# Patient Record
Sex: Female | Born: 1960 | Race: White | Hispanic: No | Marital: Married | Smoking: Former smoker
Health system: Southern US, Community
[De-identification: ages and names within clinical notes are randomized; demographics above are authoritative.]

## PROBLEM LIST (undated history)

## (undated) DIAGNOSIS — J9383 Other pneumothorax: Secondary | ICD-10-CM

## (undated) DIAGNOSIS — K219 Gastro-esophageal reflux disease without esophagitis: Secondary | ICD-10-CM

## (undated) HISTORY — DX: Gastro-esophageal reflux disease without esophagitis: K21.9

## (undated) HISTORY — DX: Other pneumothorax: J93.83

---

## 1986-06-03 HISTORY — PX: AUGMENTATION MAMMAPLASTY: SUR837

## 1987-06-04 HISTORY — PX: LUNG SURGERY: SHX703

## 1998-02-02 ENCOUNTER — Other Ambulatory Visit: Admission: RE | Admit: 1998-02-02 | Discharge: 1998-02-02 | Payer: Self-pay | Admitting: Obstetrics and Gynecology

## 1999-02-15 ENCOUNTER — Other Ambulatory Visit: Admission: RE | Admit: 1999-02-15 | Discharge: 1999-02-15 | Payer: Self-pay | Admitting: Obstetrics and Gynecology

## 2000-02-19 ENCOUNTER — Other Ambulatory Visit: Admission: RE | Admit: 2000-02-19 | Discharge: 2000-02-19 | Payer: Self-pay | Admitting: Obstetrics and Gynecology

## 2000-02-21 ENCOUNTER — Encounter: Payer: Self-pay | Admitting: Obstetrics and Gynecology

## 2000-02-21 ENCOUNTER — Encounter: Admission: RE | Admit: 2000-02-21 | Discharge: 2000-02-21 | Payer: Self-pay | Admitting: Obstetrics and Gynecology

## 2004-04-02 ENCOUNTER — Ambulatory Visit: Payer: Self-pay | Admitting: Unknown Physician Specialty

## 2006-06-30 ENCOUNTER — Ambulatory Visit: Payer: Self-pay | Admitting: Unknown Physician Specialty

## 2007-04-10 ENCOUNTER — Ambulatory Visit: Payer: Self-pay | Admitting: Unknown Physician Specialty

## 2008-06-02 ENCOUNTER — Ambulatory Visit: Payer: Self-pay | Admitting: Unknown Physician Specialty

## 2010-04-02 ENCOUNTER — Ambulatory Visit: Payer: Self-pay | Admitting: Unknown Physician Specialty

## 2010-04-09 ENCOUNTER — Ambulatory Visit: Payer: Self-pay | Admitting: Unknown Physician Specialty

## 2010-04-16 ENCOUNTER — Ambulatory Visit: Payer: Self-pay

## 2010-04-16 ENCOUNTER — Ambulatory Visit: Payer: Self-pay | Admitting: Cardiology

## 2010-04-16 ENCOUNTER — Ambulatory Visit (HOSPITAL_COMMUNITY): Admission: RE | Admit: 2010-04-16 | Discharge: 2010-04-16 | Payer: Self-pay | Admitting: Unknown Physician Specialty

## 2010-04-18 ENCOUNTER — Encounter: Payer: Self-pay | Admitting: Cardiovascular Disease

## 2010-05-15 ENCOUNTER — Ambulatory Visit: Payer: Self-pay | Admitting: Unknown Physician Specialty

## 2010-05-22 LAB — HM MAMMOGRAPHY: HM Mammogram: NORMAL

## 2010-12-18 ENCOUNTER — Encounter: Payer: Self-pay | Admitting: Internal Medicine

## 2010-12-27 ENCOUNTER — Ambulatory Visit (AMBULATORY_SURGERY_CENTER): Payer: 59 | Admitting: *Deleted

## 2010-12-27 ENCOUNTER — Encounter: Payer: Self-pay | Admitting: Internal Medicine

## 2010-12-27 VITALS — Ht 64.0 in | Wt 130.0 lb

## 2010-12-27 DIAGNOSIS — Z1211 Encounter for screening for malignant neoplasm of colon: Secondary | ICD-10-CM

## 2010-12-27 MED ORDER — PEG-KCL-NACL-NASULF-NA ASC-C 100 G PO SOLR: ORAL | Status: DC

## 2011-01-16 ENCOUNTER — Telehealth: Payer: Self-pay | Admitting: Internal Medicine

## 2011-01-16 NOTE — Telephone Encounter (Signed)
RN OK'd taking ibuprofen for headache as directed OTC.

## 2011-01-17 ENCOUNTER — Encounter: Payer: Self-pay | Admitting: Internal Medicine

## 2011-01-17 ENCOUNTER — Ambulatory Visit (AMBULATORY_SURGERY_CENTER): Payer: 59 | Admitting: Internal Medicine

## 2011-01-17 VITALS — BP 123/68 | HR 69 | Temp 98.0°F | Resp 15 | Ht 65.0 in | Wt 125.0 lb

## 2011-01-17 DIAGNOSIS — Z1211 Encounter for screening for malignant neoplasm of colon: Secondary | ICD-10-CM

## 2011-01-17 MED ORDER — SODIUM CHLORIDE 0.9 % IV SOLN: 500.0000 mL | INTRAVENOUS | Status: DC

## 2011-01-17 NOTE — Patient Instructions (Signed)
Discharge instructions given and gone over with patient and caregiver.  We will call in the morning to follow-up.

## 2011-01-18 ENCOUNTER — Telehealth: Payer: Self-pay | Admitting: *Deleted

## 2011-01-18 NOTE — Telephone Encounter (Signed)

## 2011-04-23 LAB — HM PAP SMEAR: HM Pap smear: NORMAL

## 2011-05-06 ENCOUNTER — Encounter: Payer: Self-pay | Admitting: Internal Medicine

## 2011-05-07 ENCOUNTER — Ambulatory Visit: Payer: 59 | Admitting: Internal Medicine

## 2011-06-20 ENCOUNTER — Encounter: Payer: Self-pay | Admitting: Internal Medicine

## 2011-06-20 ENCOUNTER — Ambulatory Visit (INDEPENDENT_AMBULATORY_CARE_PROVIDER_SITE_OTHER): Payer: 59 | Admitting: Internal Medicine

## 2011-06-20 DIAGNOSIS — N951 Menopausal and female climacteric states: Secondary | ICD-10-CM

## 2011-06-20 DIAGNOSIS — M25552 Pain in left hip: Secondary | ICD-10-CM

## 2011-06-20 DIAGNOSIS — Z1211 Encounter for screening for malignant neoplasm of colon: Secondary | ICD-10-CM

## 2011-06-20 DIAGNOSIS — M25559 Pain in unspecified hip: Secondary | ICD-10-CM

## 2011-06-20 DIAGNOSIS — Z780 Asymptomatic menopausal state: Secondary | ICD-10-CM

## 2011-06-20 DIAGNOSIS — K219 Gastro-esophageal reflux disease without esophagitis: Secondary | ICD-10-CM

## 2011-06-20 DIAGNOSIS — J9383 Other pneumothorax: Secondary | ICD-10-CM

## 2011-06-20 NOTE — Patient Instructions (Signed)
You need to take 2000 units of Vit d3 daily through te winter and reduce to 1000 units daily once Spring is here  You need to get 1200 mg of calcium daily either through diet or supplements

## 2011-06-20 NOTE — Progress Notes (Signed)
  Subjective:    Patient ID: Joy Reid, female    DOB: 1960-11-30, 51 y.o.   MRN: [id number]  HPI [name] is a 72 yr menopausal white female with a history of bilateral spontaneous pnuemothoraces in her early twenties, s/p stapling who presents for establishment of primary care.  She has been having lateral left hip pain for several days now after jumping up and falling onto her bed during a recreational romp with her new puppy.  The pain was intense and she is worried that she might have broken her hip .  Her other complaint is recent onset of upper abdominal bloating and discomfort accompanied by a feeling of fullness.  The symptoms do not change with stooling or eating.  She has had no unintenional weight loss but thinks she may have gained a few lbs in the last several months.  No change in bowel habits,  She denies postprandial or otherwise vomiting but does report occasional mild transient nausea.  Thre is a remote history of tobacco abuse but no reported history of alcohol abuse or CA and she is up to fate on GYN screening.  Scheduled to see [name] in the next 30 days for evaluation of symptoms, recent colonoscopy was normal.   Past Medical History  Diagnosis Date  . GERD (gastroesophageal reflux disease)   . Spontaneous pneumothorax     as a teenager, bilateral (sequential) s/p surgical stapling  . Menopause        No current outpatient prescriptions on file prior to visit.   Review of Systems  Constitutional: Negative for fever, chills and unexpected weight change.  HENT: Negative for hearing loss, ear pain, nosebleeds, congestion, sore throat, facial swelling, rhinorrhea, sneezing, mouth sores, trouble swallowing, neck pain, neck stiffness, voice change, postnasal drip, sinus pressure, tinnitus and ear discharge.   Eyes: Negative for pain, discharge, redness and visual disturbance.  Respiratory: Negative for cough, chest tightness, shortness of breath, wheezing and stridor.     Cardiovascular: Negative for chest pain, palpitations and leg swelling.  Gastrointestinal: Positive for nausea and abdominal distention.  Genitourinary: Negative for urgency, flank pain, vaginal bleeding and dyspareunia.  Musculoskeletal: Negative for myalgias and arthralgias.  Skin: Negative for color change and rash.  Neurological: Negative for dizziness, weakness, light-headedness and headaches.  Hematological: Negative for adenopathy.       Objective:   Physical Exam  Constitutional: She is oriented to person, place, and time. She appears well-developed and well-nourished.  HENT:  Mouth/Throat: Oropharynx is clear and moist.  Eyes: EOM are normal. Pupils are equal, round, and reactive to light. No scleral icterus.  Neck: Normal range of motion. Neck supple. No JVD present. No thyromegaly present.  Cardiovascular: Normal rate, regular rhythm, normal heart sounds and intact distal pulses.   Pulmonary/Chest: Effort normal and breath sounds normal.  Abdominal: Soft. Bowel sounds are normal. She exhibits no mass. There is no tenderness. There is no rebound and no guarding.  Musculoskeletal: Normal range of motion. She exhibits no edema.       Left hip: She exhibits normal range of motion, normal strength, no tenderness, no bony tenderness and no swelling.  Lymphadenopathy:    She has no cervical adenopathy.  Neurological: She is alert and oriented to person, place, and time.  Skin: Skin is warm and dry.  Psychiatric: She has a normal mood and affect.       Assessment & Plan:

## 2011-06-23 ENCOUNTER — Encounter: Payer: Self-pay | Admitting: Internal Medicine

## 2011-06-23 DIAGNOSIS — M25552 Pain in left hip: Secondary | ICD-10-CM | POA: Insufficient documentation

## 2011-06-23 DIAGNOSIS — Z1211 Encounter for screening for malignant neoplasm of colon: Secondary | ICD-10-CM | POA: Insufficient documentation

## 2011-06-23 DIAGNOSIS — K219 Gastro-esophageal reflux disease without esophagitis: Secondary | ICD-10-CM | POA: Insufficient documentation

## 2011-06-23 DIAGNOSIS — Z780 Asymptomatic menopausal state: Secondary | ICD-10-CM | POA: Insufficient documentation

## 2011-06-23 NOTE — Assessment & Plan Note (Addendum)
Prior workup unknown since the phenomena occurred over 20 years ago.  She does not appear Marfanoid.  There is a personal history of prior tobacco abuse, no endometriosis and no  family history of PTX to suggest an inheritable syndrome .  One episode did occur while flying so really cannot be considered spontaneous.

## 2011-06-23 NOTE — Assessment & Plan Note (Signed)
Screening colonoscopy was done by [name] recently and reportedly normal.

## 2011-06-23 NOTE — Assessment & Plan Note (Signed)
Exam is normal. Reassurance provided that it is unlikely that she has broken her hip by falling onto her bed.  She has most likely bruised the fascia from blunt trauma.

## 2011-06-23 NOTE — Assessment & Plan Note (Signed)
Occurring in the past year, with tolerable symptoms.  Disussed screening for bone loss at some point during the first 5 years with DEXA and reviewed calcium and vitamin D needs .   She does exercise regularly.

## 2011-06-23 NOTE — Assessment & Plan Note (Signed)
She has no signs of abdominal mass or peptic ulcer on exam.  Recommended tiral of omeprazole 20 mg daily pending evaluation by Dr. Juanda Chance

## 2011-07-02 ENCOUNTER — Encounter: Payer: 59 | Admitting: Internal Medicine

## 2011-07-02 NOTE — Progress Notes (Signed)
Joy Reid [birth year] MRN [id number]        History of Present Illness:  This is a    Past Medical History  Diagnosis Date  . GERD (gastroesophageal reflux disease)   . Spontaneous pneumothorax     as a teenager, bilateral (sequential) s/p surgical stapling  . Spontaneous pneumothorax     as a teenager, bilateral (sequential) s/p surgical stapling   Past Surgical History  Procedure Date  . Cesarean section 1995    x 3  . Lung surgery 1989    2nd lung stapling,  left side for recurrent PTX    reports that she quit smoking about 27 years ago. She does not have any smokeless tobacco history on file. She reports that she drinks about 1.8 ounces of alcohol per week. She reports that she does not use illicit drugs. family history includes Colon cancer in her maternal grandfather.  There is no history of Cancer, and Hearing loss, and Heart disease, and Diabetes, . No Known Allergies      Review of Systems:  The remainder of the 10 point ROS is negative except as outlined in H&P   Physical Exam: General appearance  Well developed, in no distress. Eyes- non icteric. HEENT nontraumatic, normocephalic. Mouth no lesions, tongue papillated, no cheilosis. Neck supple without adenopathy, thyroid not enlarged, no carotid bruits, no JVD. Lungs Clear to auscultation bilaterally. Cor normal S1, normal S2, regular rhythm, no murmur,  quiet precordium. Abdomen:  Rectal: Extremities no pedal edema. Skin no lesions. Neurological alert and oriented x 3. Psychological normal mood and affect.  Assessment and Plan:   07/02/2011 Joy Reid

## 2011-07-03 NOTE — Progress Notes (Signed)
This encounter was created in error - please disregard.

## 2011-07-23 ENCOUNTER — Telehealth: Payer: Self-pay | Admitting: Internal Medicine

## 2011-07-23 NOTE — Telephone Encounter (Signed)
Message copied by Arna Snipe on Tue Jul 23, 2011  3:06 PM ------      Message from: Richardson Chiquito      Created: Wed Jul 03, 2011  8:02 AM                   ----- Message -----         From: Hart Carwin, MD         Sent: 07/02/2011   5:34 PM           To: Vernia Buff, CMA            Please charge for no show.      ----- Message -----         From: Vernia Buff, CMA         Sent: 07/02/2011  10:47 AM           To: Hart Carwin, MD            Patient n/s appt with Dr Juanda Chance on 07/02/11. Dr Juanda Chance, do you want to charge late cancellation/no show fee?

## 2011-08-12 ENCOUNTER — Telehealth: Payer: Self-pay | Admitting: Internal Medicine

## 2011-08-12 ENCOUNTER — Ambulatory Visit: Payer: 59 | Admitting: Internal Medicine

## 2011-08-12 NOTE — Telephone Encounter (Signed)
Patient would like an xray she states that she jumped back in December she felt something in her left hip she didn't hear anything but she was in a lot of pain. Please advise.

## 2011-08-12 NOTE — Telephone Encounter (Signed)
Patient made an appt for today.

## 2011-08-13 ENCOUNTER — Ambulatory Visit (INDEPENDENT_AMBULATORY_CARE_PROVIDER_SITE_OTHER): Payer: 59 | Admitting: Internal Medicine

## 2011-08-13 ENCOUNTER — Encounter: Payer: Self-pay | Admitting: Internal Medicine

## 2011-08-13 DIAGNOSIS — M25552 Pain in left hip: Secondary | ICD-10-CM

## 2011-08-13 DIAGNOSIS — M25559 Pain in unspecified hip: Secondary | ICD-10-CM

## 2011-08-13 MED ORDER — TRAMADOL HCL 50 MG PO TABS
50.0000 mg | ORAL_TABLET | Freq: Three times a day (TID) | ORAL | Status: AC | PRN
Start: 2011-08-13 — End: 2011-08-23

## 2011-08-13 MED ORDER — MELOXICAM 15 MG PO TABS: 15.0000 mg | ORAL_TABLET | Freq: Every day | ORAL | Status: AC

## 2011-08-13 NOTE — Assessment & Plan Note (Signed)
Secondary to iliotibial tract syndrome .  NSAIDs, heat, massage, pain relievers.  Pt offered but deferred.

## 2011-08-13 NOTE — Progress Notes (Signed)
  Subjective:    Patient ID: Joy Reid, female    DOB: [birth year], 51 y.o.   MRN: [id number]  HPI Ms. Hartzog returns with persistent left lateral thigh /hip pain  That has persisted for two months after falling onto her bed during a romp with her dog. Pain is aggravated by lying on either side, but not by ascending or descending stairs. No swelling.  Has not started exercising,  Has gained a few pounds.   Past Medical History  Diagnosis Date  . GERD (gastroesophageal reflux disease)   . Spontaneous pneumothorax     as a teenager, bilateral (sequential) s/p surgical stapling  . Spontaneous pneumothorax     as a teenager, bilateral (sequential) s/p surgical stapling   No current outpatient prescriptions on file prior to visit.    Review of Systems  Constitutional: Negative for fever, chills and unexpected weight change.  HENT: Negative for hearing loss, ear pain, nosebleeds, congestion, sore throat, facial swelling, rhinorrhea, sneezing, mouth sores, trouble swallowing, neck pain, neck stiffness, voice change, postnasal drip, sinus pressure, tinnitus and ear discharge.   Eyes: Negative for pain, discharge, redness and visual disturbance.  Respiratory: Negative for cough, chest tightness, shortness of breath, wheezing and stridor.   Cardiovascular: Negative for chest pain, palpitations and leg swelling.  Musculoskeletal: Positive for arthralgias. Negative for myalgias.  Skin: Negative for color change and rash.  Neurological: Negative for dizziness, weakness, light-headedness and headaches.  Hematological: Negative for adenopathy.       Objective:   Physical Exam  Constitutional: She is oriented to person, place, and time. She appears well-developed and well-nourished.  HENT:  Mouth/Throat: Oropharynx is clear and moist.  Eyes: EOM are normal. Pupils are equal, round, and reactive to light. No scleral icterus.  Neck: Normal range of motion. Neck supple. No JVD present. No thyromegaly  present.  Cardiovascular: Normal rate, regular rhythm, normal heart sounds and intact distal pulses.   Pulmonary/Chest: Effort normal and breath sounds normal.  Abdominal: Soft. Bowel sounds are normal. She exhibits no mass. There is no tenderness.  Musculoskeletal: Normal range of motion. She exhibits tenderness. She exhibits no edema.       Arms: Lymphadenopathy:    She has no cervical adenopathy.  Neurological: She is alert and oriented to person, place, and time.  Skin: Skin is warm and dry.  Psychiatric: She has a normal mood and affect.      Assessment & Plan:    Hip pain, left Secondary to iliotibial tract syndrome .  NSAIDs, heat, massage, pain relievers.  Pt offered but deferred.    Updated Medication List Outpatient Encounter Prescriptions as of 08/13/2011  Medication Sig Dispense Refill  . meloxicam (MOBIC) 15 MG tablet Take 1 tablet (15 mg total) by mouth daily.  30 tablet  0  . traMADol (ULTRAM) 50 MG tablet Take 1 tablet (50 mg total) by mouth every 8 (eight) hours as needed for pain.  60 tablet  0

## 2011-08-13 NOTE — Patient Instructions (Signed)
You have an iliotibial tract injury which is causing your pain  I recommended trying 1 meloxicam daily as your anti inflammatory ,  Instead of advil or alleve.  add tramadol as needed for pain.Marland Kitchen  Heating pad for 15 minutes prior to stretching and exercise.

## 2011-08-16 ENCOUNTER — Telehealth: Payer: Self-pay | Admitting: *Deleted

## 2011-08-16 NOTE — Telephone Encounter (Signed)
Triage Record Num: [id number] Operator: Neita Goodnight Patient Name: Joy Reid Call Date & Time: 08/16/2011 4:32:34PM Patient Phone: [phone] PCP: Duncan Dull Patient Gender: Female PCP Fax : [phone] Patient DOB: December 17, 1960 Practice Name: [facility] Reason for Call: Caller: [name]/Patient is calling requesting abx. Cell Phone [phone]. Caller states she was "just seen by Dr. Darrick Huntsman, and they "discussed the cough, congestion, etc." Has had occasional sniffles, but runny nose worsened on 03/15, clear nasal discharge. Possible fever to touch on 03/15. Triage offered and declined. Pt was advised no abx to be called in or requested without an office visit, however she "requests to ask the doctor this time since she was just seen." (RN has no access to [facility] due to server she is currently on.) Per office, she was seen 03/12 for hip pain, and there is no note in her chart about being assessed for cold. Pt advised again of no abx to be called in without a visit. Recommended she go to UC if needed. Protocol(s) Used: Office Note Recommended Outcome per Protocol: Information Noted and Sent to Office Reason for Outcome: Caller information to office Care Advice: ~ 03/

## 2011-08-19 ENCOUNTER — Telehealth: Payer: Self-pay | Admitting: Internal Medicine

## 2011-08-19 NOTE — Telephone Encounter (Signed)
[facility] Triage Call Report Triage Record Num: [id number] Operator: Arline Asp Loftin Patient Name: Joy Reid Call Date & Time: 08/16/2011 4:32:34PM Patient Phone: [phone] PCP: Duncan Dull Patient Gender: Female PCP Fax : [phone] Patient DOB: 07/03/1960 Practice Name: [facility] Reason for Call: Caller: Noreta/Patient is calling requesting abx. Cell Phone [phone]. Caller states she was "just seen by Dr. Darrick Huntsman, and they "discussed the cough, congestion, etc." Has had occasional sniffles, but runny nose worsened on 03/15, clear nasal discharge. Possible fever to touch on 03/15. Triage offered and declined. Pt was advised no abx to be called in or requested without an office visit, however she "requests to ask the doctor this time since she was just seen." (RN has no access to [facility] due to server she is currently on.) Per office, she was seen 03/12 for hip pain, and there is no note in her chart about being assessed for cold. Pt advised again of no abx to be called in without a visit. Recommended she go to UC if needed. Protocol(s) Used: Office Note Recommended Outcome per Protocol: Information Noted and Sent to Office Reason for Outcome: Caller information to office Care Advice: ~ 08/16/2011 4:50:43PM Page 1 of 1 [email or web address]

## 2011-11-26 DIAGNOSIS — M722 Plantar fascial fibromatosis: Secondary | ICD-10-CM | POA: Insufficient documentation

## 2011-11-26 DIAGNOSIS — M79673 Pain in unspecified foot: Secondary | ICD-10-CM | POA: Insufficient documentation

## 2011-11-26 DIAGNOSIS — Q667 Congenital pes cavus, unspecified foot: Secondary | ICD-10-CM | POA: Insufficient documentation

## 2011-11-26 DIAGNOSIS — M7750 Other enthesopathy of unspecified foot: Secondary | ICD-10-CM | POA: Insufficient documentation

## 2011-12-10 ENCOUNTER — Ambulatory Visit: Payer: 59 | Admitting: Internal Medicine

## 2012-06-04 ENCOUNTER — Telehealth: Payer: Self-pay | Admitting: Internal Medicine

## 2012-06-04 NOTE — Telephone Encounter (Signed)
Patient is wanting Dr. Melina Schools E-mail address.

## 2012-06-08 NOTE — Telephone Encounter (Signed)
Information given to pt.

## 2012-06-08 NOTE — Telephone Encounter (Signed)
You may give Stewart my e mail and my cell phone.  [email or web address]    And [phone]

## 2012-07-01 ENCOUNTER — Telehealth: Payer: Self-pay | Admitting: *Deleted

## 2012-07-01 DIAGNOSIS — R5383 Other fatigue: Secondary | ICD-10-CM

## 2012-07-01 DIAGNOSIS — Z13220 Encounter for screening for lipoid disorders: Secondary | ICD-10-CM

## 2012-07-01 DIAGNOSIS — E559 Vitamin D deficiency, unspecified: Secondary | ICD-10-CM

## 2012-07-01 NOTE — Telephone Encounter (Signed)
Labs ordered.

## 2012-07-01 NOTE — Addendum Note (Signed)
Addended by: Sherlene Shams on: 07/01/2012 05:15 PM   Modules accepted: Orders

## 2012-07-01 NOTE — Telephone Encounter (Signed)
Pt is coming in for labs tomorrow (01.30.2014) what labs and dx would you like? Thank you 

## 2012-07-02 ENCOUNTER — Other Ambulatory Visit: Payer: 59

## 2012-07-07 ENCOUNTER — Telehealth: Payer: Self-pay | Admitting: Internal Medicine

## 2012-07-07 DIAGNOSIS — E559 Vitamin D deficiency, unspecified: Secondary | ICD-10-CM | POA: Insufficient documentation

## 2012-07-07 MED ORDER — ERGOCALCIFEROL 1.25 MG (50000 UT) PO CAPS: 50000.0000 [IU] | ORAL_CAPSULE | ORAL | Status: DC

## 2012-07-07 NOTE — Telephone Encounter (Signed)
Pt.notified

## 2012-07-07 NOTE — Telephone Encounter (Signed)
All labs normal,. Including thyroid function and cholesterol , except her vitamin d is low at 16 (normal is 30 to 100) . rx Drisdol for weekly use x 3 months,  Then in the spring she can resume otc vit d 3 1000 units daily

## 2012-07-09 ENCOUNTER — Encounter: Payer: 59 | Admitting: Internal Medicine

## 2012-07-13 ENCOUNTER — Encounter: Payer: Self-pay | Admitting: Internal Medicine

## 2012-08-10 ENCOUNTER — Encounter: Payer: 59 | Admitting: Internal Medicine

## 2012-12-08 DIAGNOSIS — R490 Dysphonia: Secondary | ICD-10-CM | POA: Insufficient documentation

## 2012-12-18 ENCOUNTER — Ambulatory Visit (INDEPENDENT_AMBULATORY_CARE_PROVIDER_SITE_OTHER): Payer: BC Managed Care – PPO | Admitting: Internal Medicine

## 2012-12-18 ENCOUNTER — Encounter: Payer: Self-pay | Admitting: Internal Medicine

## 2012-12-18 VITALS — BP 108/72 | HR 62 | Temp 98.1°F | Resp 14 | Ht 65.0 in | Wt 126.5 lb

## 2012-12-18 DIAGNOSIS — Z1239 Encounter for other screening for malignant neoplasm of breast: Secondary | ICD-10-CM

## 2012-12-18 DIAGNOSIS — Z0000 Encounter for general adult medical examination without abnormal findings: Secondary | ICD-10-CM

## 2012-12-18 DIAGNOSIS — Z1211 Encounter for screening for malignant neoplasm of colon: Secondary | ICD-10-CM

## 2012-12-18 NOTE — Progress Notes (Signed)
Patient ID: Joy Reid, female   DOB: 1961/03/22, 52 y.o.   MRN: [id number]  Subjective:     Joy Reid is a 52 y.o. female and is here for a comprehensive physical exam. The patient reports problems.  She is considering surgery by Dr. 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 Marital Status: Married    Spouse Name: N/A    Number of Children: N/A  . Years of Education: N/A   Occupational History  . Not on file.   Social History Main Topics  . Smoking status: Former Smoker    Quit date: 12/27/1983  . Smokeless tobacco: Not on file  . Alcohol Use: 1.8 oz/week    3 Cans of beer per week  . Drug Use: No  . Sexually Active: Not on file   Other Topics Concern  . Not on file   Social History Narrative  . No narrative on file   Health Maintenance  Topic Date Due  . Mammogram  05/15/2012  . Influenza Vaccine  02/01/2013  . Pap Smear  03/13/2014  . Tetanus/tdap  06/19/2020  . Colonoscopy  01/16/2021    The following portions of the patient's history were reviewed and updated as appropriate: allergies, current medications, past family history, past medical history, past social history, past surgical history and problem list.  Review of Systems A comprehensive review of systems was negative.   Objective:   BP 108/72  Pulse 62  Temp(Src) 98.1 F (36.7 C) (Oral)  Resp 14  Ht 5\' 5"  (1.651 m)  Wt 126 lb 8 oz (57.38 kg)  BMI 21.05 kg/m2  SpO2 99%  General Appearance:    Alert, cooperative, no distress, appears stated age  Head:    Normocephalic, without obvious abnormality, atraumatic  Eyes:    PERRL, conjunctiva/corneas clear, EOM's intact, fundi    benign, both eyes  Ears:    Normal TM's and external ear canals, both ears  Nose:   Nares normal, septum midline, mucosa normal, no drainage    or sinus tenderness  Throat:   Lips, mucosa, and tongue  normal; teeth and gums normal  Neck:   Supple, symmetrical, trachea midline, no adenopathy;    thyroid:  no enlargement/tenderness/nodules; no carotid   bruit or JVD  Back:     Symmetric, no curvature, ROM normal, no CVA tenderness  Lungs:     Clear to auscultation bilaterally, respirations unlabored  Chest Wall:    No tenderness or deformity   Heart:    Regular rate and rhythm, S1 and S2 normal, no murmur, rub   or gallop  Breast Exam:    Bilateral implants, no tenderness, masses, or nipple abnormality  Abdomen:     Soft, non-tender, bowel sounds active all four quadrants,    no masses, no organomegaly  Genitalia:    Pelvic: cervix normal in appearance, external genitalia normal, no adnexal masses or tenderness, no cervical motion tenderness, rectovaginal septum normal, uterus normal size, shape, and consistency and vagina normal without discharge  Extremities:   Extremities normal, atraumatic, no cyanosis or edema  Pulses:   2+ and symmetric all extremities  Skin:   Skin color, texture, turgor normal, no rashes or lesions  Lymph nodes:   Cervical, supraclavicular, and axillary nodes normal  Neurologic:   CNII-XII intact, normal strength, sensation and reflexes    throughout  Assessment:   Screening for colon cancer Normal colonoscopy in 2012  Encounter for preventive health examination Annual comprehensive exam was done including breast, pelvic without PAP smear. All screenings have been addressed .   Screening for breast cancer She is long overdue for mammogram .  This has been ordered today    Updated Medication List Outpatient Encounter Prescriptions as of 12/18/2012  Medication Sig Dispense Refill  . ergocalciferol (DRISDOL) 50000 UNITS capsule Take 1 capsule (50,000 Units total) by mouth once a week.  12 capsule  0   No facility-administered encounter medications on file as of 12/18/2012.

## 2012-12-18 NOTE — Assessment & Plan Note (Addendum)
Normal colonoscopy in 2012

## 2012-12-20 DIAGNOSIS — Z1239 Encounter for other screening for malignant neoplasm of breast: Secondary | ICD-10-CM | POA: Insufficient documentation

## 2012-12-20 NOTE — Assessment & Plan Note (Signed)
Annual comprehensive exam was done including breast, pelvic without PAP smear. All screenings have been addressed .  

## 2012-12-20 NOTE — Assessment & Plan Note (Signed)
She is long overdue for mammogram .  This has been ordered today

## 2012-12-25 ENCOUNTER — Ambulatory Visit: Payer: Self-pay | Admitting: Unknown Physician Specialty

## 2013-03-16 ENCOUNTER — Encounter (INDEPENDENT_AMBULATORY_CARE_PROVIDER_SITE_OTHER): Payer: Self-pay

## 2013-03-16 ENCOUNTER — Ambulatory Visit (INDEPENDENT_AMBULATORY_CARE_PROVIDER_SITE_OTHER): Payer: BC Managed Care – PPO | Admitting: Internal Medicine

## 2013-03-16 ENCOUNTER — Encounter: Payer: Self-pay | Admitting: Internal Medicine

## 2013-03-16 VITALS — BP 110/70 | HR 63 | Temp 98.3°F | Ht 65.0 in | Wt 124.8 lb

## 2013-03-16 DIAGNOSIS — N390 Urinary tract infection, site not specified: Secondary | ICD-10-CM

## 2013-03-16 LAB — POCT URINALYSIS DIPSTICK
Bilirubin, UA: NEGATIVE
Blood, UA: NEGATIVE
Glucose, UA: NEGATIVE
Nitrite, UA: NEGATIVE
Urobilinogen, UA: 0.2

## 2013-03-17 ENCOUNTER — Encounter: Payer: Self-pay | Admitting: Internal Medicine

## 2013-03-17 NOTE — Progress Notes (Signed)
  Subjective:    Patient ID: Joy Reid, female    DOB: August 04, 1960, 52 y.o.   MRN: [id number]  HPI 52 year old female with past history of GERD who comes in today as a work in with concerns regarding some increased urinary urgency and pressure.  No significant dysuria.  No hematuria.  No abdominal pain or cramping.  No fever.  Eating and drinking well.  No vaginal itching, irritation or discharge.     Past Medical History  Diagnosis Date  . GERD (gastroesophageal reflux disease)   . Spontaneous pneumothorax     as a teenager, bilateral (sequential) s/p surgical stapling  . Spontaneous pneumothorax     as a teenager, bilateral (sequential) s/p surgical stapling    Review of Systems Patient denies any fever.  No nausea or vomiting.  No abdominal pain or cramping.  Some minimal suprapubic pressure.  No significant pain.  No nausea or vomiting.  Eating and drinking well.  No bowel change, such as diarrhea.  Some increased urgency.  Some increased pressure.  No dysuria or hematuria.         Objective:   Physical Exam Filed Vitals:   03/16/13 0913  BP: 110/70  Pulse: 63  Temp: 98.3 F (77.49 C)   52 year old female in no acute distress.  HEART:  Appears to be regular. LUNGS:  No crackles or wheezing audible.  Respirations even and unlabored.    ABDOMEN:  Soft, nontender.  Bowel sounds present and normal.  No audible abdominal bruit.  GU:  She declined.       Assessment & Plan:  GU.  With the urgency and pressure as outlined, urinalysis obtained.  Urine dip negative.  Discussed with her today regarding further evaluation and treatment options.  Will hold on abx.  Add culture.  Discussed vaginal evaluation.  She declined.  Wants to wait on culture results.  Will hold on pyridium since no significant pain.  F/u on culture.  Pt comfortable with this plan.

## 2013-03-18 LAB — URINE CULTURE
Colony Count: NO GROWTH
Organism ID, Bacteria: NO GROWTH

## 2013-12-16 ENCOUNTER — Other Ambulatory Visit: Payer: Self-pay | Admitting: Internal Medicine

## 2013-12-16 DIAGNOSIS — Z1239 Encounter for other screening for malignant neoplasm of breast: Secondary | ICD-10-CM

## 2013-12-16 NOTE — Progress Notes (Signed)
Placed order for Dr. Derrel Nip to sign for yearly Mammo screening

## 2013-12-27 ENCOUNTER — Ambulatory Visit
Admission: RE | Admit: 2013-12-27 | Discharge: 2013-12-27 | Disposition: A | Discharge status: Home / Self Care | Payer: BC Managed Care – PPO | Source: Ambulatory Visit | Attending: Internal Medicine | Admitting: Internal Medicine

## 2013-12-27 ENCOUNTER — Other Ambulatory Visit: Payer: Self-pay | Admitting: Internal Medicine

## 2013-12-27 DIAGNOSIS — Z1239 Encounter for other screening for malignant neoplasm of breast: Secondary | ICD-10-CM

## 2013-12-27 DIAGNOSIS — Z1231 Encounter for screening mammogram for malignant neoplasm of breast: Secondary | ICD-10-CM

## 2014-01-27 ENCOUNTER — Telehealth: Payer: Self-pay | Admitting: Internal Medicine

## 2014-01-27 NOTE — Telephone Encounter (Signed)
Farheen saw me in the hospital yesterday and spoke at length about her mother having mental status changes that may have indicated she had a stroke. I would like to her her mother in asap to see (but I can't remember her name and it was too awkward to ask because she had called a few days ago to talk about her mom's macular degeneration and I deferred) Can you call her Driday morning and schedule her mother for 1:00 pm?

## 2014-01-28 ENCOUNTER — Telehealth: Payer: Self-pay | Admitting: Internal Medicine

## 2014-01-28 NOTE — Telephone Encounter (Signed)
Patient's mother is Norman Herrlich, I already have a 1:00 appt today so we'll have to give her an appt on Monday , you can use 1:00 then

## 2014-01-28 NOTE — Telephone Encounter (Signed)
Patient's mother is Norman Herrlich,  I already have a 1:00 appt today so we'll have to give her an appt on Monday , you can use 1:00 then

## 2014-01-28 NOTE — Telephone Encounter (Signed)
Spoke with Rosaleah, she will check with patient and confirm Monday at 1:00 will work, will call back to confirm

## 2014-01-28 NOTE — Telephone Encounter (Signed)
See other phone note

## 2014-05-31 ENCOUNTER — Telehealth: Payer: Self-pay | Admitting: Internal Medicine

## 2014-05-31 NOTE — Telephone Encounter (Signed)
Patient called for acute visit for sinus offered appointment for 06/01/14 patient declined stating she would make another arrangement due to prior engagement.

## 2014-07-28 ENCOUNTER — Telehealth: Payer: Self-pay | Admitting: Internal Medicine

## 2014-07-28 NOTE — Telephone Encounter (Signed)
[facility]  Patient Name: Joy Reid  DOB: [birth year]    Initial Comment Caller states that ran into the closet a week ago and still has a black eye from it and has a dull headache from it. Had fallen down the states 3 days before.    Nurse Assessment  Nurse: Harlow Mares, RN, Suanne Marker Date/Time (Eastern Time): 07/28/2014 4:31:45 PM  Confirm and document reason for call. If symptomatic, describe symptoms. ---Caller states that ran into the closet a week ago and still has a black eye from it and has a dull headache from it. Had fallen down the states 3 days before. She reports that she fell down 3 steps and hit her knees very hard. (was this last Friday), she reports that she was hit by the closet door and was almost knocked out. She had a bruise on the (L) eye, upper lip busted inside and out, bruising on her chin. Both eyes are a bit black, but the (L) eye is worse. Rates headache= 2-3 and is intermittent. Describes as a dull ache.  Has the patient traveled out of the country within the last 30 days? ---No  Does the patient require triage? ---Yes  Related visit to physician within the last 2 weeks? ---No  Does the PT have any chronic conditions? (i.e. diabetes, asthma, etc.) ---No     Guidelines    Guideline Title Affirmed Question Affirmed Notes  Head Injury [1] After 72 hours AND [2] headache persists    Final Disposition User   See PCP When Office is Open (within 3 days) Harlow Mares, RN, Suanne Marker    Comments  Appointment scheduled with Lorane Gell, NP for 07/29/14 @ 11:30am. caller voiced understanding.Marland Kitchen

## 2014-07-28 NOTE — Telephone Encounter (Signed)
FYI

## 2014-07-29 ENCOUNTER — Encounter: Payer: Self-pay | Admitting: Internal Medicine

## 2014-07-29 ENCOUNTER — Encounter (INDEPENDENT_AMBULATORY_CARE_PROVIDER_SITE_OTHER): Payer: Self-pay

## 2014-07-29 ENCOUNTER — Ambulatory Visit: Payer: Self-pay | Admitting: Nurse Practitioner

## 2014-07-29 ENCOUNTER — Ambulatory Visit (INDEPENDENT_AMBULATORY_CARE_PROVIDER_SITE_OTHER)
Admission: RE | Admit: 2014-07-29 | Discharge: 2014-07-29 | Disposition: A | Discharge status: Home / Self Care | Payer: BLUE CROSS/BLUE SHIELD | Source: Ambulatory Visit | Attending: Internal Medicine | Admitting: Internal Medicine

## 2014-07-29 ENCOUNTER — Ambulatory Visit (INDEPENDENT_AMBULATORY_CARE_PROVIDER_SITE_OTHER): Payer: BLUE CROSS/BLUE SHIELD | Admitting: Internal Medicine

## 2014-07-29 VITALS — BP 118/64 | HR 59 | Temp 97.3°F | Resp 16 | Ht 65.0 in | Wt 129.0 lb

## 2014-07-29 DIAGNOSIS — S069X9S Unspecified intracranial injury with loss of consciousness of unspecified duration, sequela: Secondary | ICD-10-CM | POA: Diagnosis not present

## 2014-07-29 DIAGNOSIS — G44309 Post-traumatic headache, unspecified, not intractable: Secondary | ICD-10-CM

## 2014-07-29 DIAGNOSIS — M542 Cervicalgia: Secondary | ICD-10-CM

## 2014-07-29 DIAGNOSIS — S060X9S Concussion with loss of consciousness of unspecified duration, sequela: Secondary | ICD-10-CM

## 2014-07-29 DIAGNOSIS — S060XAS Concussion with loss of consciousness status unknown, sequela: Secondary | ICD-10-CM

## 2014-07-29 DIAGNOSIS — M25552 Pain in left hip: Secondary | ICD-10-CM | POA: Diagnosis not present

## 2014-07-29 MED ORDER — METHOCARBAMOL 750 MG PO TABS: 750.0000 mg | ORAL_TABLET | Freq: Four times a day (QID) | ORAL | Status: DC

## 2014-07-29 MED ORDER — MELOXICAM 15 MG PO TABS: 15.0000 mg | ORAL_TABLET | Freq: Every day | ORAL | Status: DC

## 2014-07-29 NOTE — Assessment & Plan Note (Signed)
Secondary to muscle spasm.  Cervical spine films to rule out alignment issues.  meloxicam and methocarbamol

## 2014-07-29 NOTE — Patient Instructions (Addendum)
MELOXICAM  ONCE DAILY INSTEAD OF ALEVE OR MOTRIN'  TYLENOL UP TO 4 TIMES DAILY TO SUPPLEMENT MELOXICAM  ROBAXIN (METHOCARBAMOL) IS A MUSCLE RELAXER AND WILL MAKE YOU SLEEPY   PLEASE GO GET PLAIN  FILMS (X RAYS ) OF CERVICAL SPINE AT [facility]   AFTER 1:00
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
## 2014-07-29 NOTE — Telephone Encounter (Signed)
Patient on schedule for 11:30.

## 2014-07-29 NOTE — Assessment & Plan Note (Deleted)
Patient's neurologic exam is normal and there is no scalp, zygoma or facial tenderness. melosicam and tylenol.

## 2014-07-29 NOTE — Assessment & Plan Note (Signed)
Patient's neurologic exam is normal and there is no scalp, zygoma or facial tenderness. melosicam and tylenol.

## 2014-07-29 NOTE — Progress Notes (Signed)
Pre-visit discussion using our clinic review tool. No additional management support is needed unless otherwise documented below in the visit note.  

## 2015-02-28 ENCOUNTER — Other Ambulatory Visit: Payer: Self-pay | Admitting: Internal Medicine

## 2015-02-28 DIAGNOSIS — M5090 Cervical disc disorder, unspecified, unspecified cervical region: Secondary | ICD-10-CM | POA: Insufficient documentation

## 2015-03-01 ENCOUNTER — Other Ambulatory Visit: Payer: Self-pay | Admitting: Internal Medicine

## 2015-03-01 DIAGNOSIS — E785 Hyperlipidemia, unspecified: Secondary | ICD-10-CM

## 2015-03-01 DIAGNOSIS — E559 Vitamin D deficiency, unspecified: Secondary | ICD-10-CM

## 2015-03-01 DIAGNOSIS — Z113 Encounter for screening for infections with a predominantly sexual mode of transmission: Secondary | ICD-10-CM

## 2015-03-01 DIAGNOSIS — R5383 Other fatigue: Secondary | ICD-10-CM

## 2015-03-02 ENCOUNTER — Other Ambulatory Visit: Payer: Self-pay

## 2015-03-03 ENCOUNTER — Other Ambulatory Visit (INDEPENDENT_AMBULATORY_CARE_PROVIDER_SITE_OTHER): Payer: BLUE CROSS/BLUE SHIELD

## 2015-03-03 DIAGNOSIS — Z113 Encounter for screening for infections with a predominantly sexual mode of transmission: Secondary | ICD-10-CM

## 2015-03-03 DIAGNOSIS — E559 Vitamin D deficiency, unspecified: Secondary | ICD-10-CM | POA: Diagnosis not present

## 2015-03-03 DIAGNOSIS — E785 Hyperlipidemia, unspecified: Secondary | ICD-10-CM | POA: Diagnosis not present

## 2015-03-03 DIAGNOSIS — R5383 Other fatigue: Secondary | ICD-10-CM | POA: Diagnosis not present

## 2015-03-03 LAB — LIPID PANEL
CHOL/HDL RATIO: 2
Cholesterol: 233 mg/dL — ABNORMAL HIGH (ref 0–200)
HDL: 96.1 mg/dL (ref >39.00)
LDL CALC: 127 mg/dL — AB (ref 0–99)
NONHDL: 137.39
TRIGLYCERIDES: 51 mg/dL (ref 0.0–149.0)
VLDL: 10.2 mg/dL (ref 0.0–40.0)

## 2015-03-03 LAB — COMPREHENSIVE METABOLIC PANEL
ALK PHOS: 45 U/L (ref 39–117)
ALT: 15 U/L (ref 0–35)
AST: 20 U/L (ref 0–37)
Albumin: 4.4 g/dL (ref 3.5–5.2)
BILIRUBIN TOTAL: 0.6 mg/dL (ref 0.2–1.2)
BUN: 12 mg/dL (ref 6–23)
CO2: 30 mEq/L (ref 19–32)
CREATININE: 0.68 mg/dL (ref 0.40–1.20)
Calcium: 9.3 mg/dL (ref 8.4–10.5)
Chloride: 106 mEq/L (ref 96–112)
GFR: 94.94 mL/min (ref >60.00)
GLUCOSE: 97 mg/dL (ref 70–99)
Potassium: 4.7 mEq/L (ref 3.5–5.1)
SODIUM: 143 meq/L (ref 135–145)
TOTAL PROTEIN: 6.5 g/dL (ref 6.0–8.3)

## 2015-03-03 LAB — CBC WITH DIFFERENTIAL/PLATELET
Basophils Absolute: 0 10*3/uL (ref 0.0–0.1)
Basophils Relative: 0.9 % (ref 0.0–3.0)
EOS ABS: 0.1 10*3/uL (ref 0.0–0.7)
Eosinophils Relative: 2.2 % (ref 0.0–5.0)
HCT: 43.8 % (ref 36.0–46.0)
Hemoglobin: 14.5 g/dL (ref 12.0–15.0)
LYMPHS ABS: 1.8 10*3/uL (ref 0.7–4.0)
Lymphocytes Relative: 38.1 % (ref 12.0–46.0)
MCHC: 33.1 g/dL (ref 30.0–36.0)
MCV: 89.9 fl (ref 78.0–100.0)
MONOS PCT: 8.4 % (ref 3.0–12.0)
Monocytes Absolute: 0.4 10*3/uL (ref 0.1–1.0)
NEUTROS ABS: 2.3 10*3/uL (ref 1.4–7.7)
Neutrophils Relative %: 50.4 % (ref 43.0–77.0)
PLATELETS: 224 10*3/uL (ref 150.0–400.0)
RBC: 4.87 Mil/uL (ref 3.87–5.11)
RDW: 13.2 % (ref 11.5–15.5)
WBC: 4.6 10*3/uL (ref 4.0–10.5)

## 2015-03-03 LAB — TSH: TSH: 0.79 u[IU]/mL (ref 0.35–4.50)

## 2015-03-03 LAB — VITAMIN D 25 HYDROXY (VIT D DEFICIENCY, FRACTURES): VITD: 21.22 ng/mL — AB (ref 30.00–100.00)

## 2015-03-04 LAB — HEPATITIS C ANTIBODY: HCV Ab: NEGATIVE

## 2015-03-04 LAB — HIV ANTIBODY (ROUTINE TESTING W REFLEX): HIV: NONREACTIVE

## 2015-03-05 ENCOUNTER — Other Ambulatory Visit: Payer: Self-pay | Admitting: Internal Medicine

## 2015-03-05 MED ORDER — ERGOCALCIFEROL 1.25 MG (50000 UT) PO CAPS: 50000.0000 [IU] | ORAL_CAPSULE | ORAL | Status: DC

## 2015-03-06 ENCOUNTER — Encounter: Payer: Self-pay | Admitting: *Deleted

## 2015-03-14 ENCOUNTER — Encounter (INDEPENDENT_AMBULATORY_CARE_PROVIDER_SITE_OTHER): Payer: Self-pay

## 2015-03-14 ENCOUNTER — Encounter: Payer: Self-pay | Admitting: Nurse Practitioner

## 2015-03-14 ENCOUNTER — Ambulatory Visit (INDEPENDENT_AMBULATORY_CARE_PROVIDER_SITE_OTHER): Payer: BLUE CROSS/BLUE SHIELD | Admitting: Nurse Practitioner

## 2015-03-14 VITALS — BP 108/64 | HR 68 | Temp 97.7°F | Resp 12 | Ht 65.0 in | Wt 125.2 lb

## 2015-03-14 DIAGNOSIS — T148 Other injury of unspecified body region: Secondary | ICD-10-CM

## 2015-03-14 DIAGNOSIS — T148XXA Other injury of unspecified body region, initial encounter: Secondary | ICD-10-CM

## 2015-03-14 MED ORDER — CEPHALEXIN 500 MG PO CAPS: 500.0000 mg | ORAL_CAPSULE | Freq: Four times a day (QID) | ORAL | Status: DC

## 2015-03-14 NOTE — Progress Notes (Signed)
Patient ID: Joy Reid, female    DOB: 1960/07/17  Age: 54 y.o. MRN: [id number]  CC: Hand Pain   HPI Joy Reid presents for left hand pain S/p fork puncturing skin between fingers.   1) Last night pt reports she was loading the dishwasher and a dirty fork punctured the skin between the first and 2nd fingers of the left hand. She noticed redness, tenderness, and swelling that woke her up around 3 AM this morning. Patient took some Advil and went back to sleep. She has not noticed any discharge from the wound of the left hand.  History Joy Reid has a past medical history of GERD (gastroesophageal reflux disease); Spontaneous pneumothorax; and Spontaneous pneumothorax.   She has past surgical history that includes Cesarean section (1995) and Lung surgery (1989).   Her family history includes Colon cancer in her maternal grandfather. There is no history of Cancer, Hearing loss, Heart disease, Diabetes, or Early death.She reports that she quit smoking about 31 years ago. She does not have any smokeless tobacco history on file. She reports that she drinks about 1.8 oz of alcohol per week. She reports that she does not use illicit drugs.  Outpatient Prescriptions Prior to Visit  Medication Sig Dispense Refill  . ergocalciferol (DRISDOL) 50000 UNITS capsule Take 1 capsule (50,000 Units total) by mouth once a week. 4 capsule 0  . meloxicam (MOBIC) 15 MG tablet Take 1 tablet (15 mg total) by mouth daily. 30 tablet 0  . methocarbamol (ROBAXIN-750) 750 MG tablet Take 1 tablet (750 mg total) by mouth 4 (four) times daily. AS NEEDED FOR MUSCLE SPASM 60 tablet 1   No facility-administered medications prior to visit.    ROS Review of Systems  Constitutional: Negative for fever, chills, diaphoresis and fatigue.  Skin: Positive for color change and wound.    Objective:  BP 108/64 mmHg  Pulse 68  Temp(Src) 97.7 F (36.5 C) (Oral)  Resp 12  Ht 5\' 5"  (1.651 m)  Wt 125 lb 3.2 oz (56.79 kg)  BMI  20.83 kg/m2  SpO2 98%  Physical Exam  Constitutional: She is oriented to person, place, and time. She appears well-developed and well-nourished. No distress.  HENT:  Head: Normocephalic and atraumatic.  Right Ear: External ear normal.  Left Ear: External ear normal.  Musculoskeletal:       Hands: Red dot- puncture site- scabbed and non-purulent Blue area- site of erythema, swelling, and tenderness  Neurological: She is alert and oriented to person, place, and time. No cranial nerve deficit. She exhibits normal muscle tone. Coordination normal.  Skin: Skin is warm and dry. No rash noted. She is not diaphoretic. There is erythema.  Psychiatric: She has a normal mood and affect. Her behavior is normal. Judgment and thought content normal.   Assessment & Plan:   There are no diagnoses linked to this encounter. I have discontinued Joy Reid methocarbamol and meloxicam. I am also having her start on cephALEXin. Additionally, I am having her maintain her ergocalciferol.  Meds ordered this encounter  Medications  . cephALEXin (KEFLEX) 500 MG capsule    Sig: Take 1 capsule (500 mg total) by mouth 4 (four) times daily.    Dispense:  10 capsule    Refill:  0    Order Specific Question:  Supervising Provider    Answer:  Crecencio Mc [[id number]]     Follow-up: Return if symptoms worsen or fail to improve.

## 2015-03-14 NOTE — Patient Instructions (Addendum)
NSAIDs for pain and decrease in inflammation- Aleve, Ibuprofen, Advil ect... Please take a probiotic ( Align, Floraque or Culturelle) while you are on the antibiotic to prevent a serious antibiotic associated diarrhea  Called clostirudium dificile colitis and a vaginal yeast infection   Call us if not helpful in 48 hours.  Puncture Wound A puncture wound is an injury that is caused by a sharp, thin object that goes through (penetrates) your skin. Usually, a puncture wound does not leave a large opening in your skin, so it may not bleed a lot. However, when you get a puncture wound, dirt or other materials (foreign bodies) can be forced into your wound and break off inside. This increases the chance of infection, such as tetanus. CAUSES Puncture wounds are caused by any sharp, thin object that goes through your skin, such as:  Animal teeth, as with an animal bite.  Sharp, pointed objects, such as nails, splinters of glass, fishhooks, and needles. SYMPTOMS Symptoms of a puncture wound include:  Pain.  Bleeding.  Swelling.  Bruising.  Fluid leaking from the wound.  Numbness, tingling, or loss of function. DIAGNOSIS This condition is diagnosed with a medical history and physical exam. Your wound will be checked to see if it contains any foreign bodies. You may also have X-rays or other imaging tests. TREATMENT Treatment for a puncture wound depends on how serious the wound is. It also depends on whether the wound contains any foreign bodies. Treatment for all types of puncture wounds usually starts with:  Controlling the bleeding.  Washing out the wound with a germ-free (sterile) salt-water solution.  Checking the wound for foreign bodies. Treatment may also include:  Having the wound opened surgically to remove a foreign object.  Closing the wound with stitches (sutures) if it continues to bleed.  Covering the wound with antibiotic ointments and a bandage  (dressing).  Receiving a tetanus shot.  Receiving a rabies vaccine. HOME CARE INSTRUCTIONS Medicines  Take or apply over-the-counter and prescription medicines only as told by your health care provider.  If you were prescribed an antibiotic, take or apply it as told by your health care provider. Do not stop using the antibiotic even if your condition improves. Wound Care  There are many ways to close and cover a wound. For example, a wound can be covered with sutures, skin glue, or adhesive strips. Follow instructions from your health care provider about:  How to take care of your wound.  When and how you should change your dressing.  When you should remove your dressing.  Removing whatever was used to close your wound.  Keep the dressing dry as told by your health care provider. Do not take baths, swim, use a hot tub, or do anything that would put your wound underwater until your health care provider approves.  Clean the wound as told by your health care provider.  Do not scratch or pick at the wound.  Check your wound every day for signs of infection. Watch for:  Redness, swelling, or pain.  Fluid, blood, or pus. General Instructions  Raise (elevate) the injured area above the level of your heart while you are sitting or lying down.  If your puncture wound is in your foot, ask your health care provider if you need to avoid putting weight on your foot and for how long.  Keep all follow-up visits as told by your health care provider. This is important. SEEK MEDICAL CARE IF:  You received a  tetanus shot and you have swelling, severe pain, redness, or bleeding at the injection site.  You have a fever.  Your sutures come out.  You notice a bad smell coming from your wound or your dressing.  You notice something coming out of your wound, such as wood or glass.  Your pain is not controlled with medicine.  You have increased redness, swelling, or pain at the site of  your wound.  You have fluid, blood, or pus coming from your wound.  You notice a change in the color of your skin near your wound.  You need to change the dressing frequently due to fluid, blood, or pus draining from your wound.  You develop a new rash.  You develop numbness around your wound. SEEK IMMEDIATE MEDICAL CARE IF:  You develop severe swelling around your wound.  Your pain suddenly increases and is severe.  You develop painful skin lumps.  You have a red streak going away from your wound.  The wound is on your hand or foot and you cannot properly move a finger or toe.  The wound is on your hand or foot and you notice that your fingers or toes look pale or bluish.   This information is not intended to replace advice given to you by your health care provider. Make sure you discuss any questions you have with your health care provider.   Document Released: 02/27/2005 Document Revised: 02/08/2015 Document Reviewed: 07/13/2014 Elsevier Interactive Patient Education [employer].

## 2015-03-14 NOTE — Progress Notes (Signed)
Pre visit review using our clinic review tool, if applicable. No additional management support is needed unless otherwise documented below in the visit note. 

## 2015-03-15 ENCOUNTER — Other Ambulatory Visit: Payer: Self-pay | Admitting: Nurse Practitioner

## 2015-03-15 ENCOUNTER — Telehealth: Payer: Self-pay | Admitting: Nurse Practitioner

## 2015-03-15 DIAGNOSIS — T148XXA Other injury of unspecified body region, initial encounter: Secondary | ICD-10-CM | POA: Insufficient documentation

## 2015-03-15 MED ORDER — CEPHALEXIN 500 MG PO CAPS: 500.0000 mg | ORAL_CAPSULE | Freq: Two times a day (BID) | ORAL | Status: DC

## 2015-03-15 NOTE — Telephone Encounter (Signed)
Pt called and states that she had to go out of town unexpectedly and forgot her antibiotic at home. Wants to know if she can have her Cephalexin sent to [facility] in [place]?

## 2015-03-15 NOTE — Telephone Encounter (Signed)
Sent it in to [place] [facility] (with correct instructions- previously I had take 4 x daily, should be twice daily). Its [facility] on [address]. Thanks!

## 2015-03-15 NOTE — Assessment & Plan Note (Signed)
One of the times before punctured the left hand. Fork was from dinner that night being loaded into the dishwasher. Patient has not tried anything besides some anti-inflammatories. Asked her to continue these and added Keflex 500 mg. Prescription says 4 times a day but that was error it was meant to be taken twice a day. Encouraged probiotics will follow-up.

## 2015-03-16 ENCOUNTER — Encounter: Payer: Self-pay | Admitting: Internal Medicine

## 2015-03-17 ENCOUNTER — Other Ambulatory Visit: Payer: Self-pay | Admitting: Internal Medicine

## 2015-03-17 ENCOUNTER — Ambulatory Visit: Payer: BLUE CROSS/BLUE SHIELD

## 2015-03-27 ENCOUNTER — Encounter: Payer: Self-pay | Admitting: Internal Medicine

## 2015-03-27 ENCOUNTER — Encounter (INDEPENDENT_AMBULATORY_CARE_PROVIDER_SITE_OTHER): Payer: Self-pay

## 2015-03-27 ENCOUNTER — Ambulatory Visit (INDEPENDENT_AMBULATORY_CARE_PROVIDER_SITE_OTHER): Payer: BLUE CROSS/BLUE SHIELD | Admitting: Internal Medicine

## 2015-03-27 ENCOUNTER — Other Ambulatory Visit (HOSPITAL_COMMUNITY)
Admission: RE | Admit: 2015-03-27 | Discharge: 2015-03-27 | Disposition: A | Discharge status: Home / Self Care | Payer: BLUE CROSS/BLUE SHIELD | Source: Ambulatory Visit | Attending: Internal Medicine | Admitting: Internal Medicine

## 2015-03-27 VITALS — BP 110/64 | HR 63 | Temp 97.6°F | Resp 12 | Ht 65.0 in | Wt 126.5 lb

## 2015-03-27 DIAGNOSIS — Z23 Encounter for immunization: Secondary | ICD-10-CM | POA: Diagnosis not present

## 2015-03-27 DIAGNOSIS — Z124 Encounter for screening for malignant neoplasm of cervix: Secondary | ICD-10-CM | POA: Diagnosis not present

## 2015-03-27 DIAGNOSIS — E559 Vitamin D deficiency, unspecified: Secondary | ICD-10-CM

## 2015-03-27 DIAGNOSIS — Z0000 Encounter for general adult medical examination without abnormal findings: Secondary | ICD-10-CM

## 2015-03-27 DIAGNOSIS — M5090 Cervical disc disorder, unspecified, unspecified cervical region: Secondary | ICD-10-CM

## 2015-03-27 DIAGNOSIS — Z1151 Encounter for screening for human papillomavirus (HPV): Secondary | ICD-10-CM | POA: Insufficient documentation

## 2015-03-27 DIAGNOSIS — Z1239 Encounter for other screening for malignant neoplasm of breast: Secondary | ICD-10-CM | POA: Diagnosis not present

## 2015-03-27 DIAGNOSIS — Z01419 Encounter for gynecological examination (general) (routine) without abnormal findings: Secondary | ICD-10-CM | POA: Insufficient documentation

## 2015-03-27 MED ORDER — OMEPRAZOLE 20 MG PO CPDR: 20.0000 mg | DELAYED_RELEASE_CAPSULE | Freq: Every day | ORAL | Status: DC

## 2015-03-27 MED ORDER — MELOXICAM 15 MG PO TABS: 15.0000 mg | ORAL_TABLET | Freq: Every day | ORAL | Status: DC

## 2015-03-27 NOTE — Progress Notes (Signed)
Patient ID: Joy Reid, female    DOB: 1960-11-03  Age: 54 y.o. MRN: [id number]  The patient is here for annual Medicare wellness examination and management of other chronic and acute problems.  The risk factors are reflected in the social history.  The roster of all physicians providing medical care to patient - is listed in the Snapshot section of the chart.  Activities of daily living:  The patient is 100% independent in all ADLs: dressing, toileting, feeding as well as independent mobility  Home safety : The patient has smoke detectors in the home. They wear seatbelts.  There are no firearms at home. There is no violence in the home.   There is no risks for hepatitis, STDs or HIV. There is no   history of blood transfusion. They have no travel history to infectious disease endemic areas of the world.  The patient has seen their dentist in the last six month. They have seen their eye doctor in the last year. They admit to slight hearing difficulty with regard to whispered voices and some television programs.  They have deferred audiologic testing in the last year.  They do not  have excessive sun exposure. Discussed the need for sun protection: hats, long sleeves and use of sunscreen if there is significant sun exposure.   Diet: the importance of a healthy diet is discussed. They do have a healthy diet.  The benefits of regular aerobic exercise were discussed. She walks 4 times per week ,  20 minutes.   Depression screen: there are no signs or vegative symptoms of depression- irritability, change in appetite, anhedonia, sadness/tearfullness.  Cognitive assessment: the patient manages all their financial and personal affairs and is actively engaged. They could relate day,date,year and events; recalled 2/3 objects at 3 minutes; performed clock-face test normally.  The following portions of the patient's history were reviewed and updated as appropriate: allergies, current medications, past  family history, past medical history,  past surgical history, past social history  and problem list.  Visual acuity was not assessed per patient preference since she has regular follow up with her ophthalmologist. Hearing and body mass index were assessed and reviewed.   During the course of the visit the patient was educated and counseled about appropriate screening and preventive services including : fall prevention , diabetes screening, nutrition counseling, colorectal cancer screening, and recommended immunizations.    CC: The primary encounter diagnosis was Breast cancer screening. Diagnoses of Screening for cervical cancer, Encounter for immunization, Cervical neck pain with evidence of disc disease, Screening for breast cancer, Encounter for preventive health examination, and Vitamin D deficiency were also pertinent to this visit.  She has been taking advil 200 mg two or three times daily for previously reported radiculopathy secondary to cervical spondylosis,  And reports that her bilateral arm  numbness has resolved  History Joy Reid has a past medical history of GERD (gastroesophageal reflux disease); Spontaneous pneumothorax; and Spontaneous pneumothorax.   She has past surgical history that includes Cesarean section (1995) and Lung surgery (1989).   Her family history includes Colon cancer in her maternal grandfather. There is no history of Cancer, Hearing loss, Heart disease, Diabetes, or Early death.She reports that she quit smoking about 31 years ago. She does not have any smokeless tobacco history on file. She reports that she drinks about 1.8 oz of alcohol per week. She reports that she does not use illicit drugs.  Outpatient Prescriptions Prior to Visit  Medication Sig Dispense Refill  .  ergocalciferol (DRISDOL) 50000 UNITS capsule Take 1 capsule (50,000 Units total) by mouth once a week. 4 capsule 0  . cephALEXin (KEFLEX) 500 MG capsule Take 1 capsule (500 mg total) by mouth 2  (two) times daily. 10 capsule 0   No facility-administered medications prior to visit.    Review of Systems   Patient denies headache, fevers, malaise, unintentional weight loss, skin rash, eye pain, sinus congestion and sinus pain, sore throat, dysphagia,  hemoptysis , cough, dyspnea, wheezing, chest pain, palpitations, orthopnea, edema, abdominal pain, nausea, melena, diarrhea, constipation, flank pain, dysuria, hematuria, urinary  Frequency, nocturia, numbness, tingling, seizures,  Focal weakness, Loss of consciousness,  Tremor, insomnia, depression, anxiety, and suicidal ideation.     Objective:  BP 110/64 mmHg  Pulse 63  Temp(Src) 97.6 F (36.4 C) (Oral)  Resp 12  Ht 5\' 5"  (1.651 m)  Wt 126 lb 8 oz (57.38 kg)  BMI 21.05 kg/m2  SpO2 100%  Physical Exam   General Appearance:    Alert, cooperative, no distress, appears stated age  Head:    Normocephalic, without obvious abnormality, atraumatic  Eyes:    PERRL, conjunctiva/corneas clear, EOM's intact, fundi    benign, both eyes  Ears:    Normal TM's and external ear canals, both ears  Nose:   Nares normal, septum midline, mucosa normal, no drainage    or sinus tenderness  Throat:   Lips, mucosa, and tongue normal; teeth and gums normal  Neck:   Supple, symmetrical, trachea midline, no adenopathy;    thyroid:  no enlargement/tenderness/nodules; no carotid   bruit or JVD  Back:     Symmetric, no curvature, ROM normal, no CVA tenderness  Lungs:     Clear to auscultation bilaterally, respirations unlabored  Chest Wall:    No tenderness or deformity   Heart:    Regular rate and rhythm, S1 and S2 normal, no murmur, rub   or gallop  Breast Exam:    Breast implants noted. No tenderness, masses, or nipple abnormality  Abdomen:     Soft, non-tender, bowel sounds active all four quadrants,    no masses, no organomegaly  Genitalia:    Pelvic: cervix normal in appearance, external genitalia normal, no adnexal masses or tenderness, no  cervical motion tenderness, rectovaginal septum normal, uterus normal size, shape, and consistency and vagina normal without discharge  Extremities:   Extremities normal, atraumatic, no cyanosis or edema  Pulses:   2+ and symmetric all extremities  Skin:   Skin color, texture, turgor normal, no rashes or lesions  Lymph nodes:   Cervical, supraclavicular, and axillary nodes normal  Neurologic:   CNII-XII intact, normal strength, sensation and reflexes    throughout       Assessment & Plan:   Problem List Items Addressed This Visit    Vitamin D deficiency    Given her history of D deficiency,  2000 units of D3 daily was recommended during winter months and a return to 1000 IUS during spring/summer. Advised not to exceed recommendations without discussion given fat solubility and risk of Vit D toxicity.         Encounter for preventive health examination    Annual comprehensive preventive exam with PAP smears was done as well as an evaluation and management of chronic conditions .  During the course of the visit the patient was educated and counseled about appropriate screening and preventive services including :  diabetes screening, lipid analysis, nutrition counseling, colorectal cancer screening, and recommended  immunizations.  Printed recommendations for health maintenance screenings was given.       Screening for breast cancer    Recommended continuing annual mammography ,  Saline implants noted.       Cervical neck pain with evidence of disc disease    By plain films done Feb 2016.  Currently with improvement in previously reported left arm numbness, and normal exam today .  MRI was denied by [facility] without objective evidence of myelopathy, and she doe snot want ot pursue EMG/[place] studies.  Recommended use of pillow with cervical support. No further workup desired at this time.  Meloxicam offered for once daily NSAID use and prilosec for GI prophylaxis.       Relevant Medications    meloxicam (MOBIC) 15 MG tablet    Other Visit Diagnoses    Breast cancer screening    -  Primary    Relevant Orders    MM DIGITAL SCREENING BILATERAL    Screening for cervical cancer        Relevant Orders    Cytology - PAP    Encounter for immunization           I have discontinued Ms. Wajda's cephALEXin. I am also having her start on meloxicam and omeprazole. Additionally, I am having her maintain her ergocalciferol.  Meds ordered this encounter  Medications  . meloxicam (MOBIC) 15 MG tablet    Sig: Take 1 tablet (15 mg total) by mouth daily.    Dispense:  30 tablet    Refill:  0  . omeprazole (PRILOSEC) 20 MG capsule    Sig: Take 1 capsule (20 mg total) by mouth daily.    Dispense:  30 capsule    Refill:  3    Medications Discontinued During This Encounter  Medication Reason  . cephALEXin (KEFLEX) 500 MG capsule Completed Course    Follow-up: No Follow-up on file.   Crecencio Mc, MD

## 2015-03-27 NOTE — Patient Instructions (Addendum)
Please try the meloxicam once daily instead of multiple does of ibuprofen for your anti inflammatory  prilosec once daily to prevent gastritis   Menopause is a normal process in which your reproductive ability comes to an end. This process happens gradually over a span of months to years, usually between the ages of 57 and 34. Menopause is complete when you have missed 12 consecutive menstrual periods. It is important to talk with your health care provider about some of the most common conditions that affect postmenopausal women, such as heart disease, cancer, and bone loss (osteoporosis). Adopting a healthy lifestyle and getting preventive care can help to promote your health and wellness. Those actions can also lower your chances of developing some of these common conditions. WHAT SHOULD I KNOW ABOUT MENOPAUSE? During menopause, you may experience a number of symptoms, such as:  Moderate-to-severe hot flashes.  Night sweats.  Decrease in sex drive.  Mood swings.  Headaches.  Tiredness.  Irritability.  Memory problems.  Insomnia. Choosing to treat or not to treat menopausal changes is an individual decision that you make with your health care provider. WHAT SHOULD I KNOW ABOUT HORMONE REPLACEMENT THERAPY AND SUPPLEMENTS? Hormone therapy products are effective for treating symptoms that are associated with menopause, such as hot flashes and night sweats. Hormone replacement carries certain risks, especially as you become older. If you are thinking about using estrogen or estrogen with progestin treatments, discuss the benefits and risks with your health care provider. WHAT SHOULD I KNOW ABOUT HEART DISEASE AND STROKE? Heart disease, heart attack, and stroke become more likely as you age. This may be due, in part, to the hormonal changes that your body experiences during menopause. These can affect how your body processes dietary fats, triglycerides, and cholesterol. Heart attack and  stroke are both medical emergencies. There are many things that you can do to help prevent heart disease and stroke:  Have your blood pressure checked at least every 1-2 years. High blood pressure causes heart disease and increases the risk of stroke.  If you are 83-49 years old, ask your health care provider if you should take aspirin to prevent a heart attack or a stroke.  Do not use any tobacco products, including cigarettes, chewing tobacco, or electronic cigarettes. If you need help quitting, ask your health care provider.  It is important to eat a healthy diet and maintain a healthy weight.  Be sure to include plenty of vegetables, fruits, low-fat dairy products, and lean protein.  Avoid eating foods that are high in solid fats, added sugars, or salt (sodium).  Get regular exercise. This is one of the most important things that you can do for your health.  Try to exercise for at least 150 minutes each week. The type of exercise that you do should increase your heart rate and make you sweat. This is known as moderate-intensity exercise.  Try to do strengthening exercises at least twice each week. Do these in addition to the moderate-intensity exercise.  Know your numbers.Ask your health care provider to check your cholesterol and your blood glucose. Continue to have your blood tested as directed by your health care provider. WHAT SHOULD I KNOW ABOUT CANCER SCREENING? There are several types of cancer. Take the following steps to reduce your risk and to catch any cancer development as early as possible. Breast Cancer  Practice breast self-awareness.  This means understanding how your breasts normally appear and feel.  It also means doing regular breast self-exams.  Let your health care provider know about any changes, no matter how small.  If you are 75 or older, have a clinician do a breast exam (clinical breast exam or CBE) every year. Depending on your age, family history, and  medical history, it may be recommended that you also have a yearly breast X-ray (mammogram).  If you have a family history of breast cancer, talk with your health care provider about genetic screening.  If you are at high risk for breast cancer, talk with your health care provider about having an MRI and a mammogram every year.  Breast cancer (BRCA) gene test is recommended for women who have family members with BRCA-related cancers. Results of the assessment will determine the need for genetic counseling and BRCA1 and for BRCA2 testing. BRCA-related cancers include these types:  Breast. This occurs in males or females.  Ovarian.  Tubal. This may also be called fallopian tube cancer.  Cancer of the abdominal or pelvic lining (peritoneal cancer).  Prostate.  Pancreatic. Cervical, Uterine, and Ovarian Cancer Your health care provider may recommend that you be screened regularly for cancer of the pelvic organs. These include your ovaries, uterus, and vagina. This screening involves a pelvic exam, which includes checking for microscopic changes to the surface of your cervix (Pap test).  For women ages 21-65, health care providers may recommend a pelvic exam and a Pap test every three years. For women ages 13-65, they may recommend the Pap test and pelvic exam, combined with testing for human papilloma virus (HPV), every five years. Some types of HPV increase your risk of cervical cancer. Testing for HPV may also be done on women of any age who have unclear Pap test results.  Other health care providers may not recommend any screening for nonpregnant women who are considered low risk for pelvic cancer and have no symptoms. Ask your health care provider if a screening pelvic exam is right for you.  If you have had past treatment for cervical cancer or a condition that could lead to cancer, you need Pap tests and screening for cancer for at least 20 years after your treatment. If Pap tests have  been discontinued for you, your risk factors (such as having a new sexual partner) need to be reassessed to determine if you should start having screenings again. Some women have medical problems that increase the chance of getting cervical cancer. In these cases, your health care provider may recommend that you have screening and Pap tests more often.  If you have a family history of uterine cancer or ovarian cancer, talk with your health care provider about genetic screening.  If you have vaginal bleeding after reaching menopause, tell your health care provider.  There are currently no reliable tests available to screen for ovarian cancer. Lung Cancer Lung cancer screening is recommended for adults 40-27 years old who are at high risk for lung cancer because of a history of smoking. A yearly low-dose CT scan of the lungs is recommended if you:  Currently smoke.  Have a history of at least 30 pack-years of smoking and you currently smoke or have quit within the past 15 years. A pack-year is smoking an average of one pack of cigarettes per day for one year. Yearly screening should:  Continue until it has been 15 years since you quit.  Stop if you develop a health problem that would prevent you from having lung cancer treatment. Colorectal Cancer  This type of cancer can be  detected and can often be prevented.  Routine colorectal cancer screening usually begins at age 55 and continues through age 68.  If you have risk factors for colon cancer, your health care provider may recommend that you be screened at an earlier age.  If you have a family history of colorectal cancer, talk with your health care provider about genetic screening.  Your health care provider may also recommend using home test kits to check for hidden blood in your stool.  A small camera at the end of a tube can be used to examine your colon directly (sigmoidoscopy or colonoscopy). This is done to check for the earliest  forms of colorectal cancer.  Direct examination of the colon should be repeated every 5-10 years until age 44. However, if early forms of precancerous polyps or small growths are found or if you have a family history or genetic risk for colorectal cancer, you may need to be screened more often. Skin Cancer  Check your skin from head to toe regularly.  Monitor any moles. Be sure to tell your health care provider:  About any new moles or changes in moles, especially if there is a change in a mole's shape or color.  If you have a mole that is larger than the size of a pencil eraser.  If any of your family members has a history of skin cancer, especially at a young age, talk with your health care provider about genetic screening.  Always use sunscreen. Apply sunscreen liberally and repeatedly throughout the day.  Whenever you are outside, protect yourself by wearing long sleeves, pants, a wide-brimmed hat, and sunglasses. WHAT SHOULD I KNOW ABOUT OSTEOPOROSIS? Osteoporosis is a condition in which bone destruction happens more quickly than new bone creation. After menopause, you may be at an increased risk for osteoporosis. To help prevent osteoporosis or the bone fractures that can happen because of osteoporosis, the following is recommended:  If you are 33-49 years old, get at least 1,000 mg of calcium and at least 600 mg of vitamin D per day.  If you are older than age 90 but younger than age 47, get at least 1,200 mg of calcium and at least 600 mg of vitamin D per day.  If you are older than age 13, get at least 1,200 mg of calcium and at least 800 mg of vitamin D per day. Smoking and excessive alcohol intake increase the risk of osteoporosis. Eat foods that are rich in calcium and vitamin D, and do weight-bearing exercises several times each week as directed by your health care provider. WHAT SHOULD I KNOW ABOUT HOW MENOPAUSE AFFECTS [place]? Depression may occur at any age, but  it is more common as you become older. Common symptoms of depression include:  Low or sad mood.  Changes in sleep patterns.  Changes in appetite or eating patterns.  Feeling an overall lack of motivation or enjoyment of activities that you previously enjoyed.  Frequent crying spells. Talk with your health care provider if you think that you are experiencing depression. WHAT SHOULD I KNOW ABOUT IMMUNIZATIONS? It is important that you get and maintain your immunizations. These include:  Tetanus, diphtheria, and pertussis (Tdap) booster vaccine.  Influenza every year before the flu season begins.  Pneumonia vaccine.  Shingles vaccine. Your health care provider may also recommend other immunizations.   This information is not intended to replace advice given to you by your health care provider. Make sure you discuss any  questions you have with your health care provider.   Document Released: 07/12/2005 Document Revised: 06/10/2014 Document Reviewed: 01/20/2014 Elsevier Interactive Patient Education 2016 [employer].   rev

## 2015-03-27 NOTE — Progress Notes (Signed)
Pre-visit discussion using our clinic review tool. No additional management support is needed unless otherwise documented below in the visit note.  

## 2015-03-28 NOTE — Assessment & Plan Note (Signed)
Recommended continuing annual mammography ,  Saline implants noted.

## 2015-03-28 NOTE — Assessment & Plan Note (Signed)
Given her history of D deficiency,  2000 units of D3 daily was recommended during winter months and a return to 1000 IUS during spring/summer. Advised not to exceed recommendations without discussion given fat solubility and risk of Vit D toxicity.

## 2015-03-28 NOTE — Assessment & Plan Note (Addendum)
By plain films done Feb 2016.  Currently with improvement in previously reported left arm numbness, and normal exam today .  MRI was denied by [facility] without objective evidence of myelopathy, and she doe snot want ot pursue EMG/[place] studies.  Recommended use of pillow with cervical support. No further workup desired at this time.  Meloxicam offered for once daily NSAID use and prilosec for GI prophylaxis.

## 2015-03-28 NOTE — Assessment & Plan Note (Signed)
Annual comprehensive preventive exam with PAP smears was done as well as an evaluation and management of chronic conditions .  During the course of the visit the patient was educated and counseled about appropriate screening and preventive services including :  diabetes screening, lipid analysis, nutrition counseling, colorectal cancer screening, and recommended immunizations.  Printed recommendations for health maintenance screenings was given.

## 2015-03-29 ENCOUNTER — Encounter: Payer: Self-pay | Admitting: *Deleted

## 2015-03-29 LAB — CYTOLOGY - PAP

## 2015-07-19 ENCOUNTER — Telehealth: Payer: Self-pay | Admitting: Internal Medicine

## 2015-07-19 DIAGNOSIS — M25532 Pain in left wrist: Secondary | ICD-10-CM

## 2015-07-19 DIAGNOSIS — M25531 Pain in right wrist: Secondary | ICD-10-CM

## 2015-07-19 NOTE — Telephone Encounter (Signed)
Patient notified and voiced understanding.

## 2015-07-19 NOTE — Telephone Encounter (Signed)
Yes  i have ordered x rays  She does not need appt can go tomorrow morning to [facility] to get them.and I 'll call her once I see the results   She should ice it for 15 minutes every few hours and take aleve or motrin

## 2015-07-19 NOTE — Telephone Encounter (Signed)
Pt called about needing a xray on her right wrist. Pt states she has not fallen but as trip and grab and trying not to fall now her wrist is sore to move certain ways and to grab things. Call pt @ [phone]. Thank you!

## 2015-07-19 NOTE — Telephone Encounter (Signed)
Spoke with the patient, right wrist has been hurting for 4-5 days.  She has a few dogs at the house and believes that she may have tripped over one of them and tried to catch herself.  She states that the knot on top of your wrist is swollen on the wrist.  It doesn't hurt all the time, but when it does it is a Pain pinch pain is a 7 and then most of the time a dull throb 5, hasn't taken anything for it.  She is afraid that there is something wrong and is requesting an xray. Please advise?

## 2015-07-20 ENCOUNTER — Other Ambulatory Visit: Payer: Self-pay | Admitting: Internal Medicine

## 2015-07-20 ENCOUNTER — Ambulatory Visit
Admission: RE | Admit: 2015-07-20 | Discharge: 2015-07-20 | Disposition: A | Discharge status: Home / Self Care | Payer: BLUE CROSS/BLUE SHIELD | Source: Ambulatory Visit | Attending: Internal Medicine | Admitting: Internal Medicine

## 2015-07-20 DIAGNOSIS — M25531 Pain in right wrist: Secondary | ICD-10-CM | POA: Diagnosis present

## 2015-07-20 DIAGNOSIS — S63501A Unspecified sprain of right wrist, initial encounter: Secondary | ICD-10-CM | POA: Insufficient documentation

## 2015-08-03 ENCOUNTER — Other Ambulatory Visit: Payer: Self-pay

## 2015-08-03 DIAGNOSIS — Z1231 Encounter for screening mammogram for malignant neoplasm of breast: Secondary | ICD-10-CM

## 2015-08-30 ENCOUNTER — Ambulatory Visit: Payer: BLUE CROSS/BLUE SHIELD

## 2015-11-02 ENCOUNTER — Other Ambulatory Visit: Payer: Self-pay | Admitting: Internal Medicine

## 2015-11-02 DIAGNOSIS — R0609 Other forms of dyspnea: Secondary | ICD-10-CM

## 2015-11-02 DIAGNOSIS — R0789 Other chest pain: Secondary | ICD-10-CM

## 2015-11-14 ENCOUNTER — Telehealth: Payer: Self-pay | Admitting: Internal Medicine

## 2015-11-14 ENCOUNTER — Ambulatory Visit (INDEPENDENT_AMBULATORY_CARE_PROVIDER_SITE_OTHER)
Admission: RE | Admit: 2015-11-14 | Discharge: 2015-11-14 | Disposition: A | Discharge status: Home / Self Care | Payer: Self-pay | Source: Ambulatory Visit | Attending: Internal Medicine | Admitting: Internal Medicine

## 2015-11-14 DIAGNOSIS — R0609 Other forms of dyspnea: Secondary | ICD-10-CM

## 2015-11-14 DIAGNOSIS — K769 Liver disease, unspecified: Secondary | ICD-10-CM

## 2015-11-14 DIAGNOSIS — R0789 Other chest pain: Secondary | ICD-10-CM

## 2015-11-14 NOTE — Telephone Encounter (Signed)
Radiology called and notified the CT for cardiac scoring is available in EPIC i have printed and placed for MD to read.

## 2015-11-14 NOTE — Telephone Encounter (Signed)
I AM NOT ABEL TO SEE THE CARDIOLOGIST INTERPRETATION.  PATIENT HAS BEEN NOTIFIED OF THE LIVER LESIONS AND AN MRI HAS BEEN ORDERED.

## 2015-11-15 ENCOUNTER — Telehealth: Payer: Self-pay | Admitting: *Deleted

## 2015-11-15 NOTE — Telephone Encounter (Signed)
Have faxed for report.

## 2015-11-15 NOTE — Telephone Encounter (Signed)
FYI calcium scores posted

## 2015-11-15 NOTE — Telephone Encounter (Signed)
[name] from [facility], stated the calcium score has posted in the chart, this was a requested from the provider.  Marzetta Board [phone]

## 2015-11-16 ENCOUNTER — Encounter: Payer: Self-pay | Admitting: Internal Medicine

## 2015-11-16 NOTE — Telephone Encounter (Signed)
Patient notified

## 2015-11-24 ENCOUNTER — Other Ambulatory Visit: Payer: Self-pay | Admitting: Internal Medicine

## 2015-11-24 ENCOUNTER — Telehealth: Payer: Self-pay | Admitting: Internal Medicine

## 2015-11-24 MED ORDER — PROMETHAZINE HCL 12.5 MG PO TABS: 12.5000 mg | ORAL_TABLET | Freq: Three times a day (TID) | ORAL | Status: DC | PRN

## 2015-11-28 ENCOUNTER — Encounter: Payer: Self-pay | Admitting: Podiatry

## 2015-11-28 ENCOUNTER — Ambulatory Visit (INDEPENDENT_AMBULATORY_CARE_PROVIDER_SITE_OTHER): Payer: BLUE CROSS/BLUE SHIELD

## 2015-11-28 ENCOUNTER — Ambulatory Visit (INDEPENDENT_AMBULATORY_CARE_PROVIDER_SITE_OTHER): Payer: BLUE CROSS/BLUE SHIELD | Admitting: Podiatry

## 2015-11-28 ENCOUNTER — Telehealth: Payer: Self-pay | Admitting: Internal Medicine

## 2015-11-28 VITALS — BP 106/60 | HR 60 | Resp 18

## 2015-11-28 DIAGNOSIS — M722 Plantar fascial fibromatosis: Secondary | ICD-10-CM | POA: Diagnosis not present

## 2015-11-28 DIAGNOSIS — R52 Pain, unspecified: Secondary | ICD-10-CM

## 2015-11-28 DIAGNOSIS — S93401S Sprain of unspecified ligament of right ankle, sequela: Secondary | ICD-10-CM | POA: Diagnosis not present

## 2015-11-28 MED ORDER — MELOXICAM 15 MG PO TABS: 15.0000 mg | ORAL_TABLET | Freq: Every day | ORAL | Status: DC

## 2015-11-28 NOTE — Progress Notes (Signed)
   Subjective:    Patient ID: Joy Reid, female    DOB: [birth year], 55 y.o.   MRN: [id number]  HPI  55 year old female present the also concerns of right ankle pain after she sprained her ankle proximal to 2 months ago. At times she felt a pop. She did not seek treatment after that injury. She states the pain has decreased but she does continue to have pain. She is able to walk 2 miles a day however. She's also had pain to the bottom of her right foot to the heel which is been ongoing for several years. She states that she has pain in the morning she first gets up after walking approximately 15 steps the pain does resolve. She does feel she has a bruise in the bottom of her foot to the heel during the day as well. Scars as a throbbing sensation. No numbness or tingling. Pain does not wake her up at night. No recent treatment for this. No other complaints.   Review of Systems  All other systems reviewed and are negative.      Objective:   Physical Exam General: AAO x3, NAD  Dermatological: Skin is warm, dry and supple bilateral. Nails x 10 are well manicured; remaining integument appears unremarkable at this time. There are no open sores, no preulcerative lesions, no rash or signs of infection present.  Vascular: Dorsalis Pedis artery and Posterior Tibial artery pedal pulses are 2/4 bilateral with immedate capillary fill time. Pedal hair growth present. No varicosities and no lower extremity edema present bilateral. There is no pain with calf compression, swelling, warmth, erythema.   Neruologic: Grossly intact via light touch bilateral. Vibratory intact via tuning fork bilateral. Protective threshold with Semmes Wienstein monofilament intact to all pedal sites bilateral. Patellar and Achilles deep tendon reflexes 2+ bilateral. No Babinski or clonus noted bilateral.   Musculoskeletal: Tenderness to palpation along the plantar medial tubercle of the calcaneus at the insertion of plantar fascia  on the right foot. There is no pain along the course of the plantar fascia within the arch of the foot. Plantar fascia appears to be intact. There is no pain with lateral compression of the calcaneus or pain with vibratory sensation. There is no pain along the course or insertion of the achilles tendon. There is also discomfort on the course the ATFL and CFL of the right ankle. There is no pain along the other ankle ligaments. There is no pain with ankle, subtalar joint range of motion. There is no edema, erythema to this area. No other areas of tenderness to bilateral lower extremities.   Gait: Unassisted, Nonantalgic.      Assessment & Plan:  Right heel pain likely plantar fasciitis with ankle sprain -Treatment options discussed including all alternatives, risks, and complications -X-rays were obtained and reviewed with the patient. Small inferior calcaneal spur present. No evidence of acute fracture to in front this time. -Etiology of symptoms were discussed -Discussed steroid injection but she wishes to hold off -Prescribed mobic. Discussed side effects of the medication and directed to stop if any are to occur and call the office.  -Ankle sprain rehabilitation exercises. Ice the area and anti-inflammatories. -For heel pain antifungal as needed, stretching, icing. Dispensed night splint and plantar fascial brace. Discussed shoe gear modifications mass discuss orthotics. I discussed with her dress orthotic as she typically wears this type issue. -Follow up in 4 weeks or sooner if needed.  Celesta Gentile, DPM

## 2015-11-28 NOTE — Patient Instructions (Signed)
Acute Ankle Sprain With Phase I Rehab An acute ankle sprain is a partial or complete tear in one or more of the ligaments of the ankle due to traumatic injury. The severity of the injury depends on both the number of ligaments sprained and the grade of sprain. There are 3 grades of sprains.   A grade 1 sprain is a mild sprain. There is a slight pull without obvious tearing. There is no loss of strength, and the muscle and ligament are the correct length.  A grade 2 sprain is a moderate sprain. There is tearing of fibers within the substance of the ligament where it connects two bones or two cartilages. The length of the ligament is increased, and there is usually decreased strength.  A grade 3 sprain is a complete rupture of the ligament and is uncommon. In addition to the grade of sprain, there are three types of ankle sprains.  Lateral ankle sprains: This is a sprain of one or more of the three ligaments on the outer side (lateral) of the ankle. These are the most common sprains. Medial ankle sprains: There is one large triangular ligament of the inner side (medial) of the ankle that is susceptible to injury. Medial ankle sprains are less common. Syndesmosis, "high ankle," sprains: The syndesmosis is the ligament that connects the two bones of the lower leg. Syndesmosis sprains usually only occur with very severe ankle sprains. SYMPTOMS  Pain, tenderness, and swelling in the ankle, starting at the side of injury that may progress to the whole ankle and foot with time.  "Pop" or tearing sensation at the time of injury.  Bruising that may spread to the heel.  Impaired ability to walk soon after injury. CAUSES   Acute ankle sprains are caused by trauma placed on the ankle that temporarily forces or pries the anklebone (talus) out of its normal socket.  Stretching or tearing of the ligaments that normally hold the joint in place (usually due to a twisting injury). RISK INCREASES  WITH:  Previous ankle sprain.  Sports in which the foot may land awkwardly (i.e., basketball, volleyball, or soccer) or walking or running on uneven or rough surfaces.  Shoes with inadequate support to prevent sideways motion when stress occurs.  Poor strength and flexibility.  Poor balance skills.  Contact sports. PREVENTION   Warm up and stretch properly before activity.  Maintain physical fitness:  Ankle and leg flexibility, muscle strength, and endurance.  Cardiovascular fitness.  Balance training activities.  Use proper technique and have a coach correct improper technique.  Taping, protective strapping, bracing, or high-top tennis shoes may help prevent injury. Initially, tape is best; however, it loses most of its support function within 10 to 15 minutes.  Wear proper-fitted protective shoes (High-top shoes with taping or bracing is more effective than either alone).  Provide the ankle with support during sports and practice activities for 12 months following injury. PROGNOSIS   If treated properly, ankle sprains can be expected to recover completely; however, the length of recovery depends on the degree of injury.  A grade 1 sprain usually heals enough in 5 to 7 days to allow modified activity and requires an average of 6 weeks to heal completely.  A grade 2 sprain requires 6 to 10 weeks to heal completely.  A grade 3 sprain requires 12 to 16 weeks to heal.  A syndesmosis sprain often takes more than 3 months to heal. RELATED COMPLICATIONS   Frequent recurrence of symptoms may  result in a chronic problem. Appropriately addressing the problem the first time decreases the frequency of recurrence and optimizes healing time. Severity of the initial sprain does not predict the likelihood of later instability.  Injury to other structures (bone, cartilage, or tendon).  A chronically unstable or arthritic ankle joint is a possibility with repeated  sprains. TREATMENT Treatment initially involves the use of ice, medication, and compression bandages to help reduce pain and inflammation. Ankle sprains are usually immobilized in a walking cast or boot to allow for healing. Crutches may be recommended to reduce pressure on the injury. After immobilization, strengthening and stretching exercises may be necessary to regain strength and a full range of motion. Surgery is rarely needed to treat ankle sprains. MEDICATION   Nonsteroidal anti-inflammatory medications, such as aspirin and ibuprofen (do not take for the first 3 days after injury or within 7 days before surgery), or other minor pain relievers, such as acetaminophen, are often recommended. Take these as directed by your caregiver. Contact your caregiver immediately if any bleeding, stomach upset, or signs of an allergic reaction occur from these medications.  Ointments applied to the skin may be helpful.  Pain relievers may be prescribed as necessary by your caregiver. Do not take prescription pain medication for longer than 4 to 7 days. Use only as directed and only as much as you need. HEAT AND COLD  Cold treatment (icing) is used to relieve pain and reduce inflammation for acute and chronic cases. Cold should be applied for 10 to 15 minutes every 2 to 3 hours for inflammation and pain and immediately after any activity that aggravates your symptoms. Use ice packs or an ice massage.  Heat treatment may be used before performing stretching and strengthening activities prescribed by your caregiver. Use a heat pack or a warm soak. SEEK IMMEDIATE MEDICAL CARE IF:   Pain, swelling, or bruising worsens despite treatment.  You experience pain, numbness, discoloration, or coldness in the foot or toes.  New, unexplained symptoms develop (drugs used in treatment may produce side effects.) EXERCISES  PHASE I EXERCISES RANGE OF MOTION (ROM) AND STRETCHING EXERCISES - Ankle Sprain, Acute Phase I,  Weeks 1 to 2 These exercises may help you when beginning to restore flexibility in your ankle. You will likely work on these exercises for the 1 to 2 weeks after your injury. Once your physician, physical therapist, or athletic trainer sees adequate progress, he or she will advance your exercises. While completing these exercises, remember:   Restoring tissue flexibility helps normal motion to return to the joints. This allows healthier, less painful movement and activity.  An effective stretch should be held for at least 30 seconds.  A stretch should never be painful. You should only feel a gentle lengthening or release in the stretched tissue. RANGE OF MOTION - Dorsi/Plantar Flexion  While sitting with your right / left knee straight, draw the top of your foot upwards by flexing your ankle. Then reverse the motion, pointing your toes downward.  Hold each position for __________ seconds.  After completing your first set of exercises, repeat this exercise with your knee bent. Repeat __________ times. Complete this exercise __________ times per day.  RANGE OF MOTION - Ankle Alphabet  Imagine your right / left big toe is a pen.  Keeping your hip and knee still, write out the entire alphabet with your "pen." Make the letters as large as you can without increasing any discomfort. Repeat __________ times. Complete this exercise __________   times per day.  STRENGTHENING EXERCISES - Ankle Sprain, Acute -Phase I, Weeks 1 to 2 These exercises may help you when beginning to restore strength in your ankle. You will likely work on these exercises for 1 to 2 weeks after your injury. Once your physician, physical therapist, or athletic trainer sees adequate progress, he or she will advance your exercises. While completing these exercises, remember:   Muscles can gain both the endurance and the strength needed for everyday activities through controlled exercises.  Complete these exercises as instructed by  your physician, physical therapist, or athletic trainer. Progress the resistance and repetitions only as guided.  You may experience muscle soreness or fatigue, but the pain or discomfort you are trying to eliminate should never worsen during these exercises. If this pain does worsen, stop and make certain you are following the directions exactly. If the pain is still present after adjustments, discontinue the exercise until you can discuss the trouble with your clinician. STRENGTH - Dorsiflexors  Secure a rubber exercise band/tubing to a fixed object (i.e., table, pole) and loop the other end around your right / left foot.  Sit on the floor facing the fixed object. The band/tubing should be slightly tense when your foot is relaxed.  Slowly draw your foot back toward you using your ankle and toes.  Hold this position for __________ seconds. Slowly release the tension in the band and return your foot to the starting position. Repeat __________ times. Complete this exercise __________ times per day.  STRENGTH - Plantar-flexors   Sit with your right / left leg extended. Holding onto both ends of a rubber exercise band/tubing, loop it around the ball of your foot. Keep a slight tension in the band.  Slowly push your toes away from you, pointing them downward.  Hold this position for __________ seconds. Return slowly, controlling the tension in the band/tubing. Repeat __________ times. Complete this exercise __________ times per day.  STRENGTH - Ankle Eversion  Secure one end of a rubber exercise band/tubing to a fixed object (table, pole). Loop the other end around your foot just before your toes.  Place your fists between your knees. This will focus your strengthening at your ankle.  Drawing the band/tubing across your opposite foot, slowly, pull your little toe out and up. Make sure the band/tubing is positioned to resist the entire motion.  Hold this position for __________ seconds. Have  your muscles resist the band/tubing as it slowly pulls your foot back to the starting position.  Repeat __________ times. Complete this exercise __________ times per day.  STRENGTH - Ankle Inversion  Secure one end of a rubber exercise band/tubing to a fixed object (table, pole). Loop the other end around your foot just before your toes.  Place your fists between your knees. This will focus your strengthening at your ankle.  Slowly, pull your big toe up and in, making sure the band/tubing is positioned to resist the entire motion.  Hold this position for __________ seconds.  Have your muscles resist the band/tubing as it slowly pulls your foot back to the starting position. Repeat __________ times. Complete this exercises __________ times per day.  STRENGTH - Towel Curls  Sit in a chair positioned on a non-carpeted surface.  Place your right / left foot on a towel, keeping your heel on the floor.  Pull the towel toward your heel by only curling your toes. Keep your heel on the floor.  If instructed by your physician, physical therapist,   or athletic trainer, add weight to the end of the towel. Repeat __________ times. Complete this exercise __________ times per day.   This information is not intended to replace advice given to you by your health care provider. Make sure you discuss any questions you have with your health care provider.   Document Released: 12/19/2004 Document Revised: 06/10/2014 Document Reviewed: 09/01/2008 Elsevier Interactive Patient Education 2016 [employer]  Plantar Fasciitis With Rehab The plantar fascia is a fibrous, ligament-like, soft-tissue structure that spans the bottom of the foot. Plantar fasciitis, also called heel spur syndrome, is a condition that causes pain in the foot due to inflammation of the tissue. SYMPTOMS   Pain and tenderness on the underneath side of the foot.  Pain that worsens with standing or walking. CAUSES  Plantar fasciitis  is caused by irritation and injury to the plantar fascia on the underneath side of the foot. Common mechanisms of injury include:  Direct trauma to bottom of the foot.  Damage to a small nerve that runs under the foot where the main fascia attaches to the heel bone.  Stress placed on the plantar fascia due to bone spurs. RISK INCREASES WITH:   Activities that place stress on the plantar fascia (running, jumping, pivoting, or cutting).  Poor strength and flexibility.  Improperly fitted shoes.  Tight calf muscles.  Flat feet.  Failure to warm-up properly before activity.  Obesity. PREVENTION  Warm up and stretch properly before activity.  Allow for adequate recovery between workouts.  Maintain physical fitness:  Strength, flexibility, and endurance.  Cardiovascular fitness.  Maintain a health body weight.  Avoid stress on the plantar fascia.  Wear properly fitted shoes, including arch supports for individuals who have flat feet. PROGNOSIS  If treated properly, then the symptoms of plantar fasciitis usually resolve without surgery. However, occasionally surgery is necessary. RELATED COMPLICATIONS   Recurrent symptoms that may result in a chronic condition.  Problems of the lower back that are caused by compensating for the injury, such as limping.  Pain or weakness of the foot during push-off following surgery.  Chronic inflammation, scarring, and partial or complete fascia tear, occurring more often from repeated injections. TREATMENT  Treatment initially involves the use of ice and medication to help reduce pain and inflammation. The use of strengthening and stretching exercises may help reduce pain with activity, especially stretches of the Achilles tendon. These exercises may be performed at home or with a therapist. Your caregiver may recommend that you use heel cups of arch supports to help reduce stress on the plantar fascia. Occasionally, corticosteroid  injections are given to reduce inflammation. If symptoms persist for greater than 6 months despite non-surgical (conservative), then surgery may be recommended.  MEDICATION   If pain medication is necessary, then nonsteroidal anti-inflammatory medications, such as aspirin and ibuprofen, or other minor pain relievers, such as acetaminophen, are often recommended.  Do not take pain medication within 7 days before surgery.  Prescription pain relievers may be given if deemed necessary by your caregiver. Use only as directed and only as much as you need.  Corticosteroid injections may be given by your caregiver. These injections should be reserved for the most serious cases, because they may only be given a certain number of times. HEAT AND COLD  Cold treatment (icing) relieves pain and reduces inflammation. Cold treatment should be applied for 10 to 15 minutes every 2 to 3 hours for inflammation and pain and immediately after any activity that aggravates your  symptoms. Use ice packs or massage the area with a piece of ice (ice massage).  Heat treatment may be used prior to performing the stretching and strengthening activities prescribed by your caregiver, physical therapist, or athletic trainer. Use a heat pack or soak the injury in warm water. SEEK IMMEDIATE MEDICAL CARE IF:  Treatment seems to offer no benefit, or the condition worsens.  Any medications produce adverse side effects. EXERCISES RANGE OF MOTION (ROM) AND STRETCHING EXERCISES - Plantar Fasciitis (Heel Spur Syndrome) These exercises may help you when beginning to rehabilitate your injury. Your symptoms may resolve with or without further involvement from your physician, physical therapist or athletic trainer. While completing these exercises, remember:   Restoring tissue flexibility helps normal motion to return to the joints. This allows healthier, less painful movement and activity.  An effective stretch should be held for at  least 30 seconds.  A stretch should never be painful. You should only feel a gentle lengthening or release in the stretched tissue. RANGE OF MOTION - Toe Extension, Flexion  Sit with your right / left leg crossed over your opposite knee.  Grasp your toes and gently pull them back toward the top of your foot. You should feel a stretch on the bottom of your toes and/or foot.  Hold this stretch for __________ seconds.  Now, gently pull your toes toward the bottom of your foot. You should feel a stretch on the top of your toes and or foot.  Hold this stretch for __________ seconds. Repeat __________ times. Complete this stretch __________ times per day.  RANGE OF MOTION - Ankle Dorsiflexion, Active Assisted  Remove shoes and sit on a chair that is preferably not on a carpeted surface.  Place right / left foot under knee. Extend your opposite leg for support.  Keeping your heel down, slide your right / left foot back toward the chair until you feel a stretch at your ankle or calf. If you do not feel a stretch, slide your bottom forward to the edge of the chair, while still keeping your heel down.  Hold this stretch for __________ seconds. Repeat __________ times. Complete this stretch __________ times per day.  STRETCH - Gastroc, Standing  Place hands on wall.  Extend right / left leg, keeping the front knee somewhat bent.  Slightly point your toes inward on your back foot.  Keeping your right / left heel on the floor and your knee straight, shift your weight toward the wall, not allowing your back to arch.  You should feel a gentle stretch in the right / left calf. Hold this position for __________ seconds. Repeat __________ times. Complete this stretch __________ times per day. STRETCH - Soleus, Standing  Place hands on wall.  Extend right / left leg, keeping the other knee somewhat bent.  Slightly point your toes inward on your back foot.  Keep your right / left heel on the  floor, bend your back knee, and slightly shift your weight over the back leg so that you feel a gentle stretch deep in your back calf.  Hold this position for __________ seconds. Repeat __________ times. Complete this stretch __________ times per day. STRETCH - Gastrocsoleus, Standing  Note: This exercise can place a lot of stress on your foot and ankle. Please complete this exercise only if specifically instructed by your caregiver.   Place the ball of your right / left foot on a step, keeping your other foot firmly on the same step.  Hold on to the wall or a rail for balance.  Slowly lift your other foot, allowing your body weight to press your heel down over the edge of the step.  You should feel a stretch in your right / left calf.  Hold this position for __________ seconds.  Repeat this exercise with a slight bend in your right / left knee. Repeat __________ times. Complete this stretch __________ times per day.  STRENGTHENING EXERCISES - Plantar Fasciitis (Heel Spur Syndrome)  These exercises may help you when beginning to rehabilitate your injury. They may resolve your symptoms with or without further involvement from your physician, physical therapist or athletic trainer. While completing these exercises, remember:   Muscles can gain both the endurance and the strength needed for everyday activities through controlled exercises.  Complete these exercises as instructed by your physician, physical therapist or athletic trainer. Progress the resistance and repetitions only as guided. STRENGTH - Towel Curls  Sit in a chair positioned on a non-carpeted surface.  Place your foot on a towel, keeping your heel on the floor.  Pull the towel toward your heel by only curling your toes. Keep your heel on the floor.  If instructed by your physician, physical therapist or athletic trainer, add ____________________ at the end of the towel. Repeat __________ times. Complete this exercise  __________ times per day. STRENGTH - Ankle Inversion  Secure one end of a rubber exercise band/tubing to a fixed object (table, pole). Loop the other end around your foot just before your toes.  Place your fists between your knees. This will focus your strengthening at your ankle.  Slowly, pull your big toe up and in, making sure the band/tubing is positioned to resist the entire motion.  Hold this position for __________ seconds.  Have your muscles resist the band/tubing as it slowly pulls your foot back to the starting position. Repeat __________ times. Complete this exercises __________ times per day.    This information is not intended to replace advice given to you by your health care provider. Make sure you discuss any questions you have with your health care provider.   Document Released: 05/20/2005 Document Revised: 10/04/2014 Document Reviewed: 09/01/2008 Elsevier Interactive Patient Education [employer].

## 2015-12-26 ENCOUNTER — Ambulatory Visit: Payer: BLUE CROSS/BLUE SHIELD | Admitting: Podiatry

## 2016-02-26 ENCOUNTER — Other Ambulatory Visit: Payer: Self-pay | Admitting: Internal Medicine

## 2016-02-26 DIAGNOSIS — M5010 Cervical disc disorder with radiculopathy, unspecified cervical region: Secondary | ICD-10-CM

## 2016-03-07 ENCOUNTER — Ambulatory Visit
Admission: RE | Admit: 2016-03-07 | Discharge: 2016-03-07 | Disposition: A | Discharge status: Home / Self Care | Payer: BLUE CROSS/BLUE SHIELD | Source: Ambulatory Visit | Attending: Internal Medicine | Admitting: Internal Medicine

## 2016-03-07 DIAGNOSIS — M542 Cervicalgia: Secondary | ICD-10-CM | POA: Insufficient documentation

## 2016-03-07 DIAGNOSIS — M5030 Other cervical disc degeneration, unspecified cervical region: Secondary | ICD-10-CM | POA: Insufficient documentation

## 2016-03-07 DIAGNOSIS — M50221 Other cervical disc displacement at C4-C5 level: Secondary | ICD-10-CM | POA: Insufficient documentation

## 2016-03-07 DIAGNOSIS — M47812 Spondylosis without myelopathy or radiculopathy, cervical region: Secondary | ICD-10-CM | POA: Diagnosis not present

## 2016-03-07 DIAGNOSIS — M5010 Cervical disc disorder with radiculopathy, unspecified cervical region: Secondary | ICD-10-CM

## 2016-03-08 ENCOUNTER — Encounter: Payer: Self-pay | Admitting: Internal Medicine

## 2016-04-24 ENCOUNTER — Other Ambulatory Visit: Payer: Self-pay | Admitting: Internal Medicine

## 2016-04-24 DIAGNOSIS — N390 Urinary tract infection, site not specified: Secondary | ICD-10-CM | POA: Insufficient documentation

## 2016-04-24 MED ORDER — NITROFURANTOIN MACROCRYSTAL 100 MG PO CAPS: 100.0000 mg | ORAL_CAPSULE | Freq: Two times a day (BID) | ORAL | 0 refills | Status: DC

## 2016-04-30 ENCOUNTER — Other Ambulatory Visit: Payer: Self-pay | Admitting: Internal Medicine

## 2016-04-30 ENCOUNTER — Other Ambulatory Visit (INDEPENDENT_AMBULATORY_CARE_PROVIDER_SITE_OTHER): Payer: BLUE CROSS/BLUE SHIELD

## 2016-04-30 ENCOUNTER — Telehealth: Payer: Self-pay | Admitting: Internal Medicine

## 2016-04-30 DIAGNOSIS — R300 Dysuria: Secondary | ICD-10-CM

## 2016-04-30 LAB — URINALYSIS, ROUTINE W REFLEX MICROSCOPIC
Bilirubin Urine: NEGATIVE
Hgb urine dipstick: NEGATIVE
KETONES UR: NEGATIVE
Leukocytes, UA: NEGATIVE
NITRITE: NEGATIVE
PH: 5.5 (ref 5.0–8.0)
RBC / HPF: NONE SEEN (ref 0–?)
SPECIFIC GRAVITY, URINE: 1.015 (ref 1.000–1.030)
Total Protein, Urine: NEGATIVE
Urine Glucose: NEGATIVE
Urobilinogen, UA: 0.2 (ref 0.0–1.0)
WBC UA: NONE SEEN (ref 0–?)

## 2016-04-30 LAB — POCT URINALYSIS DIPSTICK
Bilirubin, UA: NEGATIVE
GLUCOSE UA: NEGATIVE
KETONES UA: NEGATIVE
Leukocytes, UA: NEGATIVE
Nitrite, UA: NEGATIVE
Protein, UA: NEGATIVE
RBC UA: NEGATIVE
SPEC GRAV UA: 1.015
UROBILINOGEN UA: 0.2
pH, UA: 5

## 2016-04-30 NOTE — Telephone Encounter (Signed)
patient will be coming in this afternoon to provide a urine sample .  Labs ordered.  plesase add to lab schedule

## 2016-05-02 ENCOUNTER — Telehealth: Payer: Self-pay | Admitting: *Deleted

## 2016-05-02 LAB — URINE CULTURE: ORGANISM ID, BACTERIA: NO GROWTH

## 2016-05-02 NOTE — Telephone Encounter (Signed)
Patient requested lab results  Pt contact [phone]

## 2016-05-03 NOTE — Telephone Encounter (Signed)
Looks like urine culture in chart for review .  Please advise.

## 2016-05-03 NOTE — Telephone Encounter (Signed)
Negative.  Patient was contacted

## 2016-05-03 NOTE — Telephone Encounter (Signed)
Noted  

## 2016-08-20 ENCOUNTER — Other Ambulatory Visit: Payer: Self-pay | Admitting: Internal Medicine

## 2016-08-20 DIAGNOSIS — Z1231 Encounter for screening mammogram for malignant neoplasm of breast: Secondary | ICD-10-CM

## 2016-08-21 ENCOUNTER — Ambulatory Visit: Payer: BLUE CROSS/BLUE SHIELD

## 2016-08-21 ENCOUNTER — Ambulatory Visit
Admission: RE | Admit: 2016-08-21 | Discharge: 2016-08-21 | Disposition: A | Discharge status: Home / Self Care | Payer: BLUE CROSS/BLUE SHIELD | Source: Ambulatory Visit | Attending: Internal Medicine | Admitting: Internal Medicine

## 2016-08-21 DIAGNOSIS — Z1231 Encounter for screening mammogram for malignant neoplasm of breast: Secondary | ICD-10-CM

## 2016-08-22 ENCOUNTER — Encounter: Payer: Self-pay | Admitting: Internal Medicine

## 2016-08-23 ENCOUNTER — Ambulatory Visit: Payer: BLUE CROSS/BLUE SHIELD

## 2016-10-08 ENCOUNTER — Other Ambulatory Visit: Payer: Self-pay | Admitting: Internal Medicine

## 2016-10-08 ENCOUNTER — Other Ambulatory Visit (INDEPENDENT_AMBULATORY_CARE_PROVIDER_SITE_OTHER): Payer: BLUE CROSS/BLUE SHIELD

## 2016-10-08 ENCOUNTER — Telehealth: Payer: Self-pay | Admitting: Internal Medicine

## 2016-10-08 DIAGNOSIS — R300 Dysuria: Secondary | ICD-10-CM | POA: Diagnosis not present

## 2016-10-08 LAB — POCT URINALYSIS DIPSTICK
Glucose, UA: 250
Ketones, UA: 15
NITRITE UA: POSITIVE
PH UA: 5 (ref 5.0–8.0)
PROTEIN UA: 100
Spec Grav, UA: 1.01 (ref 1.010–1.025)
Urobilinogen, UA: 4 E.U./dL — AB

## 2016-10-08 LAB — URINALYSIS, MICROSCOPIC ONLY

## 2016-10-08 NOTE — Telephone Encounter (Signed)
Patient requesting urinalysis for dysuria today .  Studies ordered as future

## 2016-10-09 ENCOUNTER — Encounter: Payer: Self-pay | Admitting: Internal Medicine

## 2016-10-09 ENCOUNTER — Other Ambulatory Visit: Payer: Self-pay | Admitting: Internal Medicine

## 2016-10-09 MED ORDER — PHENAZOPYRIDINE HCL 200 MG PO TABS: 200.0000 mg | ORAL_TABLET | Freq: Three times a day (TID) | ORAL | 0 refills | Status: DC | PRN

## 2016-10-10 ENCOUNTER — Other Ambulatory Visit: Payer: Self-pay | Admitting: Internal Medicine

## 2016-10-10 ENCOUNTER — Encounter: Payer: Self-pay | Admitting: Internal Medicine

## 2016-10-10 LAB — URINE CULTURE

## 2016-10-10 MED ORDER — CIPROFLOXACIN HCL 250 MG PO TABS: 250.0000 mg | ORAL_TABLET | Freq: Two times a day (BID) | ORAL | 0 refills | Status: DC

## 2017-01-22 ENCOUNTER — Ambulatory Visit (INDEPENDENT_AMBULATORY_CARE_PROVIDER_SITE_OTHER): Payer: BLUE CROSS/BLUE SHIELD

## 2017-01-22 ENCOUNTER — Other Ambulatory Visit: Payer: Self-pay | Admitting: Internal Medicine

## 2017-01-22 DIAGNOSIS — M79671 Pain in right foot: Secondary | ICD-10-CM | POA: Diagnosis not present

## 2017-01-23 ENCOUNTER — Encounter: Payer: Self-pay | Admitting: Internal Medicine

## 2017-02-26 NOTE — Telephone Encounter (Signed)
error 

## 2017-02-26 NOTE — Telephone Encounter (Signed)
Error

## 2017-03-03 NOTE — Telephone Encounter (Signed)
orders

## 2017-07-10 ENCOUNTER — Telehealth: Payer: Self-pay | Admitting: Internal Medicine

## 2017-07-10 ENCOUNTER — Ambulatory Visit (INDEPENDENT_AMBULATORY_CARE_PROVIDER_SITE_OTHER): Payer: BLUE CROSS/BLUE SHIELD

## 2017-07-10 ENCOUNTER — Other Ambulatory Visit: Payer: Self-pay | Admitting: Internal Medicine

## 2017-07-10 DIAGNOSIS — M79645 Pain in left finger(s): Secondary | ICD-10-CM | POA: Diagnosis not present

## 2017-07-10 DIAGNOSIS — R600 Localized edema: Secondary | ICD-10-CM | POA: Diagnosis not present

## 2017-07-10 DIAGNOSIS — M25561 Pain in right knee: Secondary | ICD-10-CM

## 2017-07-10 NOTE — Telephone Encounter (Signed)
Ok, Thank you! 

## 2017-07-10 NOTE — Telephone Encounter (Signed)
Pt came in for xrays. After xrays were done, pt was advised she needed to see [facility] for splint for her finger. Pt refused. Pt stated, "I don't think I need splint."

## 2017-07-10 NOTE — Telephone Encounter (Signed)
[place] advised patient refused to be seen for finger splint. FYI

## 2017-07-10 NOTE — Telephone Encounter (Signed)
Patient called Dr . Derrel Nip directly with concerns that she may have broken her middle finger on the left hand last night when she fell in her kitchen   She is also having right knee pain .  I have ordered plain films for both areas to be done today and will follow up on films this afternoon.  Please have patient see Juliann Pulse, to have a splint placed on finger.  Finger splints are useful not only in fractures but in flexor and extensor tendon injuries.

## 2017-07-10 NOTE — Telephone Encounter (Signed)
[name],  Do you mind putting splint on Ms Ates's finger after she has her x ray?

## 2017-07-11 ENCOUNTER — Encounter: Payer: Self-pay | Admitting: Internal Medicine

## 2017-08-06 ENCOUNTER — Other Ambulatory Visit: Payer: Self-pay | Admitting: Internal Medicine

## 2017-08-06 ENCOUNTER — Telehealth: Payer: Self-pay | Admitting: Internal Medicine

## 2017-08-06 MED ORDER — PANTOPRAZOLE SODIUM 40 MG PO TBEC: 40.0000 mg | DELAYED_RELEASE_TABLET | Freq: Every day | ORAL | 3 refills | Status: DC

## 2017-08-08 DIAGNOSIS — H11002 Unspecified pterygium of left eye: Secondary | ICD-10-CM | POA: Diagnosis not present

## 2018-08-09 ENCOUNTER — Other Ambulatory Visit: Payer: Self-pay | Admitting: Internal Medicine

## 2018-08-09 DIAGNOSIS — R0789 Other chest pain: Secondary | ICD-10-CM

## 2018-08-10 ENCOUNTER — Ambulatory Visit (INDEPENDENT_AMBULATORY_CARE_PROVIDER_SITE_OTHER): Payer: BLUE CROSS/BLUE SHIELD

## 2018-08-10 DIAGNOSIS — S2232XA Fracture of one rib, left side, initial encounter for closed fracture: Secondary | ICD-10-CM

## 2018-08-10 DIAGNOSIS — R0789 Other chest pain: Secondary | ICD-10-CM

## 2018-08-10 DIAGNOSIS — S2239XA Fracture of one rib, unspecified side, initial encounter for closed fracture: Secondary | ICD-10-CM | POA: Insufficient documentation

## 2018-08-10 NOTE — Assessment & Plan Note (Signed)
Secondary to fall. nondisplaced

## 2019-01-18 ENCOUNTER — Other Ambulatory Visit: Payer: Self-pay | Admitting: Internal Medicine

## 2019-01-18 DIAGNOSIS — Z1239 Encounter for other screening for malignant neoplasm of breast: Secondary | ICD-10-CM

## 2019-01-18 DIAGNOSIS — Z1211 Encounter for screening for malignant neoplasm of colon: Secondary | ICD-10-CM

## 2019-01-26 ENCOUNTER — Encounter: Payer: Self-pay | Admitting: Podiatry

## 2019-01-26 ENCOUNTER — Ambulatory Visit: Payer: BC Managed Care – PPO

## 2019-01-26 ENCOUNTER — Ambulatory Visit: Payer: BC Managed Care – PPO | Admitting: Podiatry

## 2019-01-26 ENCOUNTER — Other Ambulatory Visit: Payer: Self-pay

## 2019-01-26 VITALS — Temp 97.3°F

## 2019-01-26 DIAGNOSIS — M216X9 Other acquired deformities of unspecified foot: Secondary | ICD-10-CM | POA: Diagnosis not present

## 2019-01-26 DIAGNOSIS — M79671 Pain in right foot: Secondary | ICD-10-CM

## 2019-01-28 NOTE — Progress Notes (Signed)
   HPI: 58 y.o. female presenting today with a chief complaint of painful callus lesions noted to the 5th right toe and 4th left toe that have been present for the past few months. Walking and wearing shoes increases the pain. She has not had any treatment for the symptoms.  She also notes her toes do not spread apart enough when she walks. She denies any pain from this but she believes it is affecting her balance. She has not had any treatment for this. Patient is here for further evaluation and treatment.   Past Medical History:  Diagnosis Date  . GERD (gastroesophageal reflux disease)   . Spontaneous pneumothorax    as a teenager, bilateral (sequential) s/p surgical stapling  . Spontaneous pneumothorax    as a teenager, bilateral (sequential) s/p surgical stapling     Physical Exam: General: The patient is alert and oriented x3 in no acute distress.  Dermatology: Skin is warm, dry and supple bilateral lower extremities. Negative for open lesions or macerations.  Vascular: Palpable pedal pulses bilaterally. No edema or erythema noted. Capillary refill within normal limits.  Neurological: Epicritic and protective threshold grossly intact bilaterally.   Musculoskeletal Exam: Increased medial longitudinal arch noted bilaterally. Range of motion within normal limits to all pedal and ankle joints bilateral. Muscle strength 5/5 in all groups bilateral.   Assessment: 1. Cavus foot type bilateral   Plan of Care:  1. Patient evaluated.  2. Recommended OTC arch supports.  3. Discouraged wearing flat shoes.  4. Return to clinic as needed.       Edrick Kins, DPM [facility]  Dr. Edrick Kins, DPM    [address], [place] [address]                Office [phone]  Fax [phone]

## 2019-02-19 ENCOUNTER — Other Ambulatory Visit: Payer: Self-pay | Admitting: Internal Medicine

## 2019-02-19 DIAGNOSIS — N819 Female genital prolapse, unspecified: Secondary | ICD-10-CM

## 2019-02-19 DIAGNOSIS — N8110 Cystocele, unspecified: Secondary | ICD-10-CM

## 2019-02-22 ENCOUNTER — Ambulatory Visit (INDEPENDENT_AMBULATORY_CARE_PROVIDER_SITE_OTHER): Payer: Self-pay | Admitting: Internal Medicine

## 2019-02-22 ENCOUNTER — Encounter: Payer: Self-pay | Admitting: Internal Medicine

## 2019-02-22 ENCOUNTER — Other Ambulatory Visit: Payer: Self-pay | Admitting: Internal Medicine

## 2019-02-22 DIAGNOSIS — N8111 Cystocele, midline: Secondary | ICD-10-CM | POA: Insufficient documentation

## 2019-02-22 DIAGNOSIS — N814 Uterovaginal prolapse, unspecified: Secondary | ICD-10-CM

## 2019-02-22 NOTE — Assessment & Plan Note (Signed)
Aggravated/brought on by exercise activities.  Behavior modification and referral to [facility] [place] placed (patient's mother Norman Herrlich is also Dr Buel Ream patient).

## 2019-02-22 NOTE — Progress Notes (Signed)
Telephone Note  This visit type was conducted due to national recommendations for restrictions regarding the COVID-19 pandemic (e.g. social distancing).  This format is felt to be most appropriate for this patient at this time.  All issues noted in this document were discussed and addressed.  No physical exam was performed (except for noted visual exam findings with Video Visits).   I connected with@ on 02/22/19 at 10:30 AM EDT by a video enabled telemedicine application or telephone and verified that I am speaking with the correct person using two identifiers. Location patient: home Location provider: work or home office Persons participating in the virtual visit: patient, provider  I discussed the limitations, risks, security and privacy concerns of performing an evaluation and management service by telephone and the availability of in person appointments. I also discussed with the patient that there may be a patient responsible charge related to this service. The patient expressed understanding and agreed to proceed.   Reason for visit: vaginal bulge  HPI:  58 yr old post menopausal woman with history of c/s x 3 (secondary to history of spontaneous pneumothorax) presents with newly appreciated cystocele.  Patient states that she noticed a smooth surfacedm slick bulge at her introitus during a recent bath. She denies pain ,  But has developed symptoms of incomplete voiding .  One month ago  started working out 4 days per week with a trainer who has been incorporating dead lifts/squats using 30 lb weights , and jumping jacks.  ROS: See pertinent positives and negatives per HPI.  Past Medical History:  Diagnosis Date  . GERD (gastroesophageal reflux disease)   . Spontaneous pneumothorax    as a teenager, bilateral (sequential) s/p surgical stapling  . Spontaneous pneumothorax    as a teenager, bilateral (sequential) s/p surgical stapling    Past Surgical History:  Procedure Laterality  Date  . AUGMENTATION MAMMAPLASTY Bilateral 1988  . [place]   x 3  . LUNG SURGERY  1989   2nd lung stapling,  left side for recurrent PTX    Family History  Problem Relation Age of Onset  . Colon cancer Maternal Grandfather   . Cancer Neg Hx   . Hearing loss Neg Hx   . Heart disease Neg Hx   . Diabetes Neg Hx   . Early death Neg Hx   . Breast cancer Neg Hx     SOCIAL HX:  reports that she quit smoking about 35 years ago. She has never used smokeless tobacco. She reports current alcohol use of about 3.0 standard drinks of alcohol per week. She reports that she does not use drugs.   Current Outpatient Medications:  .  ciprofloxacin (CIPRO) 250 MG tablet, Take 1 tablet (250 mg total) by mouth 2 (two) times daily. (Patient not taking: Reported on 01/26/2019), Disp: 10 tablet, Rfl: 0 .  meloxicam (MOBIC) 15 MG tablet, Take 1 tablet (15 mg total) by mouth daily. (Patient not taking: Reported on 01/26/2019), Disp: 30 tablet, Rfl: 2 .  nitrofurantoin (MACRODANTIN) 100 MG capsule, Take 1 capsule (100 mg total) by mouth 2 (two) times daily. (Patient not taking: Reported on 01/26/2019), Disp: 10 capsule, Rfl: 0 .  pantoprazole (PROTONIX) 40 MG tablet, Take 1 tablet (40 mg total) by mouth daily. (Patient not taking: Reported on 01/26/2019), Disp: 30 tablet, Rfl: 3 .  phenazopyridine (PYRIDIUM) 200 MG tablet, Take 1 tablet (200 mg total) by mouth 3 (three) times daily as needed for pain. (Patient not taking:  Reported on 01/26/2019), Disp: 10 tablet, Rfl: 0  EXAM:   General impression: alert, cooperative and articulate.  No signs of being in distress  Lungs: speech is fluent sentence length suggests that patient is not short of breath and not punctuated by cough, sneezing or sniffing. Marland Kitchen   Psych: affect normal.  speech is articulate and non pressured .  Denies suicidal thoughts  ASSESSMENT AND PLAN:  Discussed the following assessment and plan:  Cystocele, midline  Cystocele,  midline Aggravated/brought on by exercise activities.  Behavior modification and referral to [facility] [place] placed (patient's mother Norman Herrlich is also Dr Buel Ream patient).     I discussed the assessment and treatment plan with the patient. The patient was provided an opportunity to ask questions and all were answered. The patient agreed with the plan and demonstrated an understanding of the instructions.   The patient was advised to call back or seek an in-person evaluation if the symptoms worsen or if the condition fails to improve as anticipated.   I provided  15 minutes of non-face-to-face time during this encounter reviewing patient's current problems and post surgeries.  Providing counseling on the above mentioned problems , and coordination  of care .  Crecencio Mc, MD

## 2019-03-05 ENCOUNTER — Other Ambulatory Visit: Payer: Self-pay

## 2019-03-05 DIAGNOSIS — Z20822 Contact with and (suspected) exposure to covid-19: Secondary | ICD-10-CM

## 2019-03-07 LAB — NOVEL CORONAVIRUS, NAA: SARS-CoV-2, NAA: NOT DETECTED

## 2019-03-08 ENCOUNTER — Ambulatory Visit
Admission: RE | Admit: 2019-03-08 | Discharge: 2019-03-08 | Disposition: A | Discharge status: Home / Self Care | Payer: BC Managed Care – PPO | Source: Ambulatory Visit | Attending: Internal Medicine | Admitting: Internal Medicine

## 2019-03-08 ENCOUNTER — Other Ambulatory Visit: Payer: Self-pay

## 2019-03-08 DIAGNOSIS — Z1239 Encounter for other screening for malignant neoplasm of breast: Secondary | ICD-10-CM

## 2019-03-08 DIAGNOSIS — Z1231 Encounter for screening mammogram for malignant neoplasm of breast: Secondary | ICD-10-CM | POA: Diagnosis not present

## 2019-03-09 ENCOUNTER — Ambulatory Visit: Payer: Self-pay | Admitting: Internal Medicine

## 2019-03-16 DIAGNOSIS — N813 Complete uterovaginal prolapse: Secondary | ICD-10-CM | POA: Diagnosis not present

## 2019-04-13 ENCOUNTER — Other Ambulatory Visit: Payer: Self-pay

## 2019-04-13 DIAGNOSIS — Z20822 Contact with and (suspected) exposure to covid-19: Secondary | ICD-10-CM

## 2019-04-15 LAB — NOVEL CORONAVIRUS, NAA: SARS-CoV-2, NAA: NOT DETECTED

## 2019-04-23 DIAGNOSIS — N393 Stress incontinence (female) (male): Secondary | ICD-10-CM | POA: Diagnosis not present

## 2019-04-23 DIAGNOSIS — N812 Incomplete uterovaginal prolapse: Secondary | ICD-10-CM | POA: Diagnosis not present

## 2019-04-23 DIAGNOSIS — N8111 Cystocele, midline: Secondary | ICD-10-CM | POA: Diagnosis not present

## 2019-05-04 HISTORY — PX: LAPAROSCOPIC SUPRACERVICAL HYSTERECTOMY: SUR797

## 2019-05-04 HISTORY — PX: OTHER SURGICAL HISTORY: SHX169

## 2019-05-06 DIAGNOSIS — N814 Uterovaginal prolapse, unspecified: Secondary | ICD-10-CM | POA: Insufficient documentation

## 2019-05-06 DIAGNOSIS — N393 Stress incontinence (female) (male): Secondary | ICD-10-CM | POA: Insufficient documentation

## 2019-05-11 DIAGNOSIS — Z6740 Type O blood, Rh positive: Secondary | ICD-10-CM | POA: Diagnosis not present

## 2019-05-11 DIAGNOSIS — N393 Stress incontinence (female) (male): Secondary | ICD-10-CM | POA: Diagnosis not present

## 2019-05-11 DIAGNOSIS — Z01818 Encounter for other preprocedural examination: Secondary | ICD-10-CM | POA: Diagnosis not present

## 2019-05-11 DIAGNOSIS — Z20828 Contact with and (suspected) exposure to other viral communicable diseases: Secondary | ICD-10-CM | POA: Diagnosis not present

## 2019-05-11 DIAGNOSIS — N814 Uterovaginal prolapse, unspecified: Secondary | ICD-10-CM | POA: Diagnosis not present

## 2019-05-11 DIAGNOSIS — Z1159 Encounter for screening for other viral diseases: Secondary | ICD-10-CM | POA: Diagnosis not present

## 2019-05-14 DIAGNOSIS — N736 Female pelvic peritoneal adhesions (postinfective): Secondary | ICD-10-CM | POA: Diagnosis not present

## 2019-05-14 DIAGNOSIS — N838 Other noninflammatory disorders of ovary, fallopian tube and broad ligament: Secondary | ICD-10-CM | POA: Diagnosis not present

## 2019-05-14 DIAGNOSIS — N813 Complete uterovaginal prolapse: Secondary | ICD-10-CM | POA: Diagnosis not present

## 2019-05-14 DIAGNOSIS — N800 Endometriosis of uterus: Secondary | ICD-10-CM | POA: Diagnosis not present

## 2019-05-14 DIAGNOSIS — N858 Other specified noninflammatory disorders of uterus: Secondary | ICD-10-CM | POA: Diagnosis not present

## 2019-05-14 DIAGNOSIS — N393 Stress incontinence (female) (male): Secondary | ICD-10-CM | POA: Diagnosis not present

## 2019-05-17 ENCOUNTER — Other Ambulatory Visit: Payer: Self-pay | Admitting: Internal Medicine

## 2019-05-17 MED ORDER — NYSTATIN 100000 UNIT/ML MT SUSP: 5.0000 mL | Freq: Four times a day (QID) | OROMUCOSAL | 0 refills | Status: DC

## 2019-05-31 ENCOUNTER — Other Ambulatory Visit: Payer: Self-pay

## 2019-05-31 ENCOUNTER — Ambulatory Visit (INDEPENDENT_AMBULATORY_CARE_PROVIDER_SITE_OTHER): Payer: BC Managed Care – PPO | Admitting: Internal Medicine

## 2019-05-31 ENCOUNTER — Encounter: Payer: Self-pay | Admitting: Internal Medicine

## 2019-05-31 VITALS — BP 112/62 | HR 83 | Temp 96.1°F | Resp 14 | Ht 65.0 in | Wt 118.2 lb

## 2019-05-31 DIAGNOSIS — R682 Dry mouth, unspecified: Secondary | ICD-10-CM | POA: Diagnosis not present

## 2019-05-31 DIAGNOSIS — Z20828 Contact with and (suspected) exposure to other viral communicable diseases: Secondary | ICD-10-CM | POA: Diagnosis not present

## 2019-05-31 DIAGNOSIS — R3589 Other polyuria: Secondary | ICD-10-CM

## 2019-05-31 DIAGNOSIS — R350 Frequency of micturition: Secondary | ICD-10-CM

## 2019-05-31 DIAGNOSIS — R634 Abnormal weight loss: Secondary | ICD-10-CM | POA: Diagnosis not present

## 2019-05-31 DIAGNOSIS — R251 Tremor, unspecified: Secondary | ICD-10-CM

## 2019-05-31 DIAGNOSIS — N393 Stress incontinence (female) (male): Secondary | ICD-10-CM

## 2019-05-31 DIAGNOSIS — R358 Other polyuria: Secondary | ICD-10-CM | POA: Diagnosis not present

## 2019-05-31 DIAGNOSIS — Z20822 Contact with and (suspected) exposure to covid-19: Secondary | ICD-10-CM

## 2019-05-31 LAB — POCT GLYCOSYLATED HEMOGLOBIN (HGB A1C): Hemoglobin A1C: 5.4 % (ref 4.0–5.6)

## 2019-05-31 NOTE — Patient Instructions (Addendum)
I am running tests to rule out  Thyroid Diabetes (type 2) Diabetes Insipidus (more rare,  But symptoms present) Urinary tract Infection sjogren's syndrome  (dry eye, dry mouth) Prior covid infection

## 2019-05-31 NOTE — Progress Notes (Signed)
Subjective:  Patient ID: Joy Reid, female    DOB: 1961/01/09  Age: 58 y.o. MRN: [id number]  CC: The primary encounter diagnosis was Unintentional weight loss of 5% body weight or less within 1 month. Diagnoses of Frequency of urination and polyuria, Dry mouth, Exposure to COVID-19 virus, Tremor, Dry mouth, unspecified, Stress incontinence of urine, and Other polyuria were also pertinent to this visit.  HPI Joy Reid presents for evaluation of polyuria with incontinence accompanied by dry mouth.  Symptoms started several weeks ago ,  Prior to  undergoing general anesthesia for a robot assisted laparascopic  Supracervical hysterectomy, salpingectomy , mesh robotic sacrocolpopexy , mid urethral mesh sling  and cystocopy   Current symptoms appear to be more Stress  Incontinence than urge incontinence.  Has been Getting up 2 times per night since surgery . Denies dysuria.  Post op hematuria has resolved.   Dry mouth and increased thirst started about 3 weeks ago,  started around the  same time .  She had been taking a new commercial pre and probiotic but stopped it  over 12 days ago (prior to surgery) and symptoms have continued.. she has also completed a 7 day course of nystatin suspension which did not resolve the coating on her tongue.   She has also noted a fine tremor of hands that does not appear to be related to fasting.  Denies polyphagia.  Has lost around 5 lbs rather easily but has been walking daily 4 to 6 miles daily for exercise   Daughter tested positive for covid in the days prior to her surgery ,  Patient tested  negative per preoperative evaluation [facility] .    No fevers,  Night sweats,  Cough,  Nausea or diarrhea no loss of taste or smell.   Patient also requesting suture removal from surgery (Dec 11).  Has not scheduled a post op visit with Dr Sharlett Iles.   This visit occurred during the SARS-CoV-2 public health emergency.  Safety protocols were in place, including screening  questions prior to the visit, additional usage of staff PPE, and extensive cleaning of exam room while observing appropriate contact time as indicated for disinfecting solutions.     Outpatient Medications Prior to Visit  Medication Sig Dispense Refill  . ciprofloxacin (CIPRO) 250 MG tablet Take 1 tablet (250 mg total) by mouth 2 (two) times daily. (Patient not taking: Reported on 01/26/2019) 10 tablet 0  . meloxicam (MOBIC) 15 MG tablet Take 1 tablet (15 mg total) by mouth daily. (Patient not taking: Reported on 01/26/2019) 30 tablet 2  . nitrofurantoin (MACRODANTIN) 100 MG capsule Take 1 capsule (100 mg total) by mouth 2 (two) times daily. (Patient not taking: Reported on 01/26/2019) 10 capsule 0  . nystatin (MYCOSTATIN) 100000 UNIT/ML suspension Take 5 mLs (500,000 Units total) by mouth 4 (four) times daily. (Patient not taking: Reported on 05/31/2019) 60 mL 0  . pantoprazole (PROTONIX) 40 MG tablet Take 1 tablet (40 mg total) by mouth daily. (Patient not taking: Reported on 01/26/2019) 30 tablet 3  . phenazopyridine (PYRIDIUM) 200 MG tablet Take 1 tablet (200 mg total) by mouth 3 (three) times daily as needed for pain. (Patient not taking: Reported on 01/26/2019) 10 tablet 0   No facility-administered medications prior to visit.    Review of Systems;  Patient denies headache, fevers, malaise, skin rash, eye pain, sinus congestion and sinus pain, sore throat, dysphagia,  hemoptysis , cough, dyspnea, wheezing, chest pain, palpitations, orthopnea, edema,  abdominal pain, nausea, melena, diarrhea, constipation, flank pain, d, numbness, tingling, seizures,  Focal weakness, Loss of consciousness,   insomnia, depression, anxiety, and suicidal ideation.      Objective:  BP 112/62 (BP Location: Left Arm, Patient Position: Sitting, Cuff Size: Normal)   Pulse 83   Temp (!) 96.1 F (35.6 C) (Temporal)   Resp 14   Ht 5\' 5"  (1.651 m)   Wt 118 lb 3.2 oz (53.6 kg)   SpO2 97%   BMI 19.67 kg/m   BP  Readings from Last 3 Encounters:  05/31/19 112/62  11/28/15 106/60  03/27/15 110/64    Wt Readings from Last 3 Encounters:  05/31/19 118 lb 3.2 oz (53.6 kg)  03/27/15 126 lb 8 oz (57.4 kg)  03/14/15 125 lb 3.2 oz (56.8 kg)    General appearance: alert, cooperative and appears stated age Ears: normal TM's and external ear canals both ears Throat: lips, mucosa, and tongue normal; teeth and gums normal.  No thrush  Neck: no adenopathy, no carotid bruit, supple, symmetrical, trachea midline and thyroid not enlarged, symmetric, no tenderness/mass/nodules Back: symmetric, no curvature. ROM normal. No CVA tenderness. Lungs: clear to auscultation bilaterally Heart: regular rate and rhythm, S1, S2 normal, no murmur, click, rub or gallop Abdomen: soft, non-tender; bowel sounds normal; no masses,  no organomegaly,  Trochar wounds and umbilical wound healing without erythema,   But several sutures are difficult to remove due to knots being subcutaneously embedded/located Pulses: 2+ and symmetric Skin: Skin color, texture, turgor normal. No rashes or lesions Lymph nodes: Cervical, supraclavicular, and axillary nodes normal.  Lab Results  Component Value Date   HGBA1C 5.4 05/31/2019    Lab Results  Component Value Date   CREATININE 0.68 03/03/2015    Lab Results  Component Value Date   WBC 4.6 03/03/2015   HGB 14.5 03/03/2015   HCT 43.8 03/03/2015   PLT 224.0 03/03/2015   GLUCOSE 97 03/03/2015   CHOL 233 (H) 03/03/2015   TRIG 51.0 03/03/2015   HDL 96.10 03/03/2015   LDLCALC 127 (H) 03/03/2015   ALT 15 03/03/2015   AST 20 03/03/2015   NA 143 03/03/2015   K 4.7 03/03/2015   CL 106 03/03/2015   CREATININE 0.68 03/03/2015   BUN 12 03/03/2015   CO2 30 03/03/2015   TSH 0.79 03/03/2015   HGBA1C 5.4 05/31/2019    MM 3D SCREEN BREAST W/IMPLANT BILATERAL  Result Date: 03/08/2019 CLINICAL DATA:  Screening. EXAM: DIGITAL SCREENING BILATERAL MAMMOGRAM WITH IMPLANTS, CAD AND TOMO The  patient has prepectoral implants. Standard and implant displaced views were performed. COMPARISON:  Previous exam(s). ACR Breast Density Category b: There are scattered areas of fibroglandular density. FINDINGS: There are no findings suspicious for malignancy. Images were processed with CAD. IMPRESSION: No mammographic evidence of malignancy. A result letter of this screening mammogram will be mailed directly to the patient. RECOMMENDATION: Screening mammogram in one year. (Code:SM-B-01Y) BI-RADS CATEGORY  1:  Negative. Electronically Signed   By: Kristopher Oppenheim M.D.   On: 03/08/2019 16:13    Assessment & Plan:   Problem List Items Addressed This Visit      Unprioritized   Dry mouth, unspecified    Ruling out Sjogren's syndrome given concurrent history of dry eye      Other polyuria    Diabetes ruled out,  Rule out UTI      Tremor    ruling out overactive thyroid      Urinary incontinence  Worse since her surgery .  Rule out UTI.  Advised to Follow up with GYN       Other Visit Diagnoses    Unintentional weight loss of 5% body weight or less within 1 month    -  Primary   Relevant Orders   POCT HgB A1C (Completed)   Thyroid Panel With TSH   Frequency of urination and polyuria       Relevant Orders   Creatinine, Urine   Osmolality, urine   Urinalysis, Routine w reflex microscopic (not at [facility]) (Completed)   Urine Culture   Dry mouth       Relevant Orders   Comprehensive metabolic panel   Sjogrens syndrome-A extractable nuclear antibody   Sjogrens syndrome-B extractable nuclear antibody   Exposure to COVID-19 virus       Relevant Orders   SAR CoV2 Serology (COVID 19)AB(IGG)IA      I have discontinued Kinsie T. Haughn's meloxicam, nitrofurantoin, phenazopyridine, ciprofloxacin, pantoprazole, and nystatin.  No orders of the defined types were placed in this encounter.   Medications Discontinued During This Encounter  Medication Reason  . ciprofloxacin (CIPRO) 250 MG  tablet Completed Course  . meloxicam (MOBIC) 15 MG tablet Patient has not taken in last 30 days  . nitrofurantoin (MACRODANTIN) 100 MG capsule Completed Course  . nystatin (MYCOSTATIN) 100000 UNIT/ML suspension Patient has not taken in last 30 days  . pantoprazole (PROTONIX) 40 MG tablet Patient has not taken in last 30 days  . phenazopyridine (PYRIDIUM) 200 MG tablet Patient has not taken in last 30 days   A total of 40 minutes was spent with patient more than half of which was spent in counseling patient on the above mentioned issues , reviewing and explaining recent labs and imaging studies done, and coordination of care. Follow-up: No follow-ups on file.   Crecencio Mc, MD

## 2019-06-01 DIAGNOSIS — R358 Other polyuria: Secondary | ICD-10-CM | POA: Insufficient documentation

## 2019-06-01 DIAGNOSIS — R32 Unspecified urinary incontinence: Secondary | ICD-10-CM | POA: Insufficient documentation

## 2019-06-01 DIAGNOSIS — R682 Dry mouth, unspecified: Secondary | ICD-10-CM | POA: Insufficient documentation

## 2019-06-01 DIAGNOSIS — R3589 Other polyuria: Secondary | ICD-10-CM | POA: Insufficient documentation

## 2019-06-01 DIAGNOSIS — R251 Tremor, unspecified: Secondary | ICD-10-CM | POA: Insufficient documentation

## 2019-06-01 LAB — URINALYSIS, ROUTINE W REFLEX MICROSCOPIC
Bilirubin Urine: NEGATIVE
Hgb urine dipstick: NEGATIVE
Ketones, ur: NEGATIVE
Leukocytes,Ua: NEGATIVE
Nitrite: NEGATIVE
RBC / HPF: NONE SEEN (ref 0–?)
Specific Gravity, Urine: >=1.03 — AB (ref 1.000–1.030)
Total Protein, Urine: NEGATIVE
Urine Glucose: NEGATIVE
Urobilinogen, UA: 0.2 (ref 0.0–1.0)
WBC, UA: NONE SEEN (ref 0–?)
pH: 5 (ref 5.0–8.0)

## 2019-06-01 NOTE — Assessment & Plan Note (Signed)
ruling out overactive thyroid

## 2019-06-01 NOTE — Assessment & Plan Note (Addendum)
Ruling out Sjogren's syndrome given concurrent history of dry eye

## 2019-06-01 NOTE — Assessment & Plan Note (Signed)
Worse since her surgery .  Rule out UTI.  Advised to Follow up with GYN

## 2019-06-01 NOTE — Assessment & Plan Note (Signed)
Diabetes ruled out,  Rule out UTI

## 2019-06-02 ENCOUNTER — Other Ambulatory Visit: Payer: Self-pay | Admitting: *Deleted

## 2019-06-02 ENCOUNTER — Encounter: Payer: Self-pay | Admitting: *Deleted

## 2019-06-02 DIAGNOSIS — R350 Frequency of micturition: Secondary | ICD-10-CM

## 2019-06-02 LAB — THYROID PANEL WITH TSH
Free Thyroxine Index: 3.1 (ref 1.4–3.8)
T3 Uptake: 34 % (ref 22–35)
T4, Total: 9.1 ug/dL (ref 5.1–11.9)
TSH: 1.05 mIU/L (ref 0.40–4.50)

## 2019-06-02 LAB — SJOGRENS SYNDROME-A EXTRACTABLE NUCLEAR ANTIBODY: SSA (Ro) (ENA) Antibody, IgG: <1 AI

## 2019-06-02 LAB — SAR COV2 SEROLOGY (COVID19)AB(IGG),IA: SARS CoV2 AB IGG: NEGATIVE

## 2019-06-02 LAB — SJOGRENS SYNDROME-B EXTRACTABLE NUCLEAR ANTIBODY: SSB (La) (ENA) Antibody, IgG: <1 AI

## 2019-06-03 ENCOUNTER — Telehealth: Payer: Self-pay

## 2019-06-03 ENCOUNTER — Other Ambulatory Visit: Payer: BC Managed Care – PPO

## 2019-06-03 ENCOUNTER — Other Ambulatory Visit: Payer: Self-pay

## 2019-06-03 ENCOUNTER — Other Ambulatory Visit: Payer: Self-pay | Admitting: Internal Medicine

## 2019-06-03 DIAGNOSIS — R350 Frequency of micturition: Secondary | ICD-10-CM

## 2019-06-03 DIAGNOSIS — R358 Other polyuria: Secondary | ICD-10-CM | POA: Diagnosis not present

## 2019-06-03 DIAGNOSIS — R3589 Other polyuria: Secondary | ICD-10-CM

## 2019-06-03 DIAGNOSIS — R634 Abnormal weight loss: Secondary | ICD-10-CM

## 2019-06-03 LAB — URINE CULTURE
MICRO NUMBER:: 1236597
Result:: NO GROWTH
SPECIMEN QUALITY:: ADEQUATE

## 2019-06-03 LAB — CREATININE, URINE, RANDOM: Creatinine, Urine: 178 mg/dL (ref 20–275)

## 2019-06-03 LAB — OSMOLALITY, URINE

## 2019-06-03 NOTE — Addendum Note (Signed)
Addended by: Elpidio Galea T on: 06/03/2019 12:35 PM   Modules accepted: Orders

## 2019-06-03 NOTE — Telephone Encounter (Signed)
Left message for patient to return call back. PATIENT NEEDS TO COME IN TODAY FOR URINE AND BLOOD WORK.

## 2019-06-03 NOTE — Progress Notes (Signed)
CMET 

## 2019-06-07 LAB — COMPREHENSIVE METABOLIC PANEL
AG Ratio: 2.4 (calc) (ref 1.0–2.5)
ALT: 14 U/L (ref 6–29)
AST: 17 U/L (ref 10–35)
Albumin: 4.6 g/dL (ref 3.6–5.1)
Alkaline phosphatase (APISO): 43 U/L (ref 37–153)
BUN: 8 mg/dL (ref 7–25)
CO2: 25 mmol/L (ref 20–32)
Calcium: 9.3 mg/dL (ref 8.6–10.4)
Chloride: 105 mmol/L (ref 98–110)
Creat: 0.68 mg/dL (ref 0.50–0.99)
Globulin: 1.9 g/dL (calc) (ref 1.9–3.7)
Glucose, Bld: 109 mg/dL — ABNORMAL HIGH (ref 65–99)
Potassium: 4 mmol/L (ref 3.5–5.3)
Sodium: 140 mmol/L (ref 135–146)
Total Bilirubin: 0.6 mg/dL (ref 0.2–1.2)
Total Protein: 6.5 g/dL (ref 6.1–8.1)

## 2019-06-07 LAB — OSMOLALITY, URINE: Osmolality, Ur: 701 mOsm/kg (ref 50–1200)

## 2019-07-01 DIAGNOSIS — Z9889 Other specified postprocedural states: Secondary | ICD-10-CM | POA: Diagnosis not present

## 2019-07-19 ENCOUNTER — Ambulatory Visit: Payer: BC Managed Care – PPO | Attending: Internal Medicine

## 2019-07-19 DIAGNOSIS — Z23 Encounter for immunization: Secondary | ICD-10-CM | POA: Insufficient documentation

## 2019-07-19 NOTE — Progress Notes (Signed)
   Covid-19 Vaccination Clinic  Name:  KARLYE DIMMOCK    MRN: [id number] DOB: [birth year]  07/19/2019  Ms. Shave was observed post Covid-19 immunization for 15 minutes without incidence. She was provided with Vaccine Information Sheet and instruction to access the V-Safe system.   Ms. Maya was instructed to call 911 with any severe reactions post vaccine: Marland Kitchen Difficulty breathing  . Swelling of your face and throat  . A fast heartbeat  . A bad rash all over your body  . Dizziness and weakness    Immunizations Administered    Name Date Dose VIS Date Route   Pfizer COVID-19 Vaccine 07/19/2019 11:25 AM 0.3 mL 05/14/2019 Intramuscular   Manufacturer: [place]   Lot: [id number]   [place]: [id number]

## 2019-08-18 ENCOUNTER — Ambulatory Visit: Payer: BC Managed Care – PPO | Attending: Internal Medicine

## 2019-08-18 DIAGNOSIS — Z23 Encounter for immunization: Secondary | ICD-10-CM

## 2019-08-18 NOTE — Progress Notes (Signed)
   Covid-19 Vaccination Clinic  Name:  Joy Reid    MRN: [id number] DOB: 01/26/1961  08/18/2019  [name] was observed post Covid-19 immunization for 15 minutes without incident. She was provided with Vaccine Information Sheet and instruction to access the V-Safe system.   [name] was instructed to call 911 with any severe reactions post vaccine: Marland Kitchen Difficulty breathing  . Swelling of face and throat  . A fast heartbeat  . A bad rash all over body  . Dizziness and weakness   Immunizations Administered    Name Date Dose VIS Date Route   Pfizer COVID-19 Vaccine 08/18/2019  8:31 AM 0.3 mL 05/14/2019 Intramuscular   Manufacturer: [place]   Lot: [id number]   [place]: [id number]

## 2019-09-27 ENCOUNTER — Other Ambulatory Visit: Payer: Self-pay | Admitting: Internal Medicine

## 2019-09-27 DIAGNOSIS — N760 Acute vaginitis: Secondary | ICD-10-CM

## 2019-09-27 DIAGNOSIS — N952 Postmenopausal atrophic vaginitis: Secondary | ICD-10-CM | POA: Insufficient documentation

## 2019-09-27 MED ORDER — FLUCONAZOLE 150 MG PO TABS: 150.0000 mg | ORAL_TABLET | Freq: Every day | ORAL | 0 refills | Status: DC

## 2019-11-09 ENCOUNTER — Encounter: Payer: Self-pay | Admitting: Internal Medicine

## 2019-11-09 ENCOUNTER — Other Ambulatory Visit: Payer: Self-pay | Admitting: Internal Medicine

## 2019-11-09 MED ORDER — ESTRADIOL 2 MG PO TABS: 2.0000 mg | ORAL_TABLET | Freq: Every day | ORAL | 5 refills | Status: DC

## 2019-11-19 DIAGNOSIS — N951 Menopausal and female climacteric states: Secondary | ICD-10-CM | POA: Diagnosis not present

## 2019-11-19 DIAGNOSIS — N393 Stress incontinence (female) (male): Secondary | ICD-10-CM | POA: Diagnosis not present

## 2019-12-10 ENCOUNTER — Other Ambulatory Visit: Payer: Self-pay | Admitting: Internal Medicine

## 2019-12-10 DIAGNOSIS — G8929 Other chronic pain: Secondary | ICD-10-CM | POA: Insufficient documentation

## 2019-12-10 DIAGNOSIS — M7062 Trochanteric bursitis, left hip: Secondary | ICD-10-CM

## 2019-12-10 MED ORDER — CELECOXIB 200 MG PO CAPS: 200.0000 mg | ORAL_CAPSULE | Freq: Two times a day (BID) | ORAL | 0 refills | Status: DC

## 2019-12-10 NOTE — Assessment & Plan Note (Signed)
celebrex bid x `1 week, then daily thereafter.  Orthopedic referral if not better in 2 weeks

## 2019-12-27 DIAGNOSIS — Z1231 Encounter for screening mammogram for malignant neoplasm of breast: Secondary | ICD-10-CM | POA: Diagnosis not present

## 2019-12-27 DIAGNOSIS — N898 Other specified noninflammatory disorders of vagina: Secondary | ICD-10-CM | POA: Diagnosis not present

## 2019-12-27 DIAGNOSIS — N951 Menopausal and female climacteric states: Secondary | ICD-10-CM | POA: Diagnosis not present

## 2019-12-27 DIAGNOSIS — Z124 Encounter for screening for malignant neoplasm of cervix: Secondary | ICD-10-CM | POA: Diagnosis not present

## 2019-12-31 ENCOUNTER — Telehealth: Payer: Self-pay | Admitting: Internal Medicine

## 2019-12-31 ENCOUNTER — Telehealth: Payer: Self-pay

## 2019-12-31 ENCOUNTER — Other Ambulatory Visit: Payer: Self-pay

## 2019-12-31 ENCOUNTER — Ambulatory Visit (INDEPENDENT_AMBULATORY_CARE_PROVIDER_SITE_OTHER): Payer: 59

## 2019-12-31 ENCOUNTER — Other Ambulatory Visit: Payer: Self-pay | Admitting: Internal Medicine

## 2019-12-31 DIAGNOSIS — M25552 Pain in left hip: Secondary | ICD-10-CM

## 2019-12-31 DIAGNOSIS — G8929 Other chronic pain: Secondary | ICD-10-CM | POA: Diagnosis not present

## 2019-12-31 DIAGNOSIS — M25551 Pain in right hip: Secondary | ICD-10-CM

## 2019-12-31 MED ORDER — PREDNISONE 10 MG PO TABS: ORAL_TABLET | ORAL | 0 refills | Status: DC

## 2019-12-31 NOTE — Telephone Encounter (Signed)
Can you call Joy Reid this morning and get her an appt time for a hip x ray at the office? Today if possible?  Thank you!

## 2019-12-31 NOTE — Telephone Encounter (Signed)
Xray ordered per verbal from Dr. Derrel Nip.

## 2019-12-31 NOTE — Telephone Encounter (Signed)
Spoke with Dr. Derrel Nip and ordered the correct xray per her verbal request.

## 2019-12-31 NOTE — Telephone Encounter (Signed)
Pt scheduled for 2:45 today. She is te

## 2019-12-31 NOTE — Telephone Encounter (Signed)
She is sending Dr. Derrel Nip a message because she wants both hips done for x-ray.

## 2020-01-08 ENCOUNTER — Other Ambulatory Visit: Payer: Self-pay | Admitting: Internal Medicine

## 2020-01-08 DIAGNOSIS — M25552 Pain in left hip: Secondary | ICD-10-CM

## 2020-01-08 DIAGNOSIS — M7072 Other bursitis of hip, left hip: Secondary | ICD-10-CM

## 2020-01-08 NOTE — Assessment & Plan Note (Signed)
Plain films of both hips were requested and normal.  Pain is chronic and lateral . IT band vs piriformis syndrome , diagnosis unclear ,  Aggravated by recent increased participation in exerise.  Referring to D r. Charlann Boxer

## 2020-01-09 ENCOUNTER — Other Ambulatory Visit: Payer: Self-pay | Admitting: Internal Medicine

## 2020-01-09 MED ORDER — METRONIDAZOLE 0.75 % VA GEL: 1.0000 | Freq: Every day | VAGINAL | 0 refills | Status: DC

## 2020-01-11 ENCOUNTER — Other Ambulatory Visit: Payer: Self-pay

## 2020-01-11 ENCOUNTER — Encounter: Payer: Self-pay | Admitting: Family Medicine

## 2020-01-11 ENCOUNTER — Ambulatory Visit (INDEPENDENT_AMBULATORY_CARE_PROVIDER_SITE_OTHER): Payer: 59 | Admitting: Family Medicine

## 2020-01-11 ENCOUNTER — Ambulatory Visit: Payer: Self-pay

## 2020-01-11 VITALS — BP 120/70 | HR 74 | Ht 65.0 in | Wt 114.0 lb

## 2020-01-11 DIAGNOSIS — M25552 Pain in left hip: Secondary | ICD-10-CM | POA: Diagnosis not present

## 2020-01-11 DIAGNOSIS — M7062 Trochanteric bursitis, left hip: Secondary | ICD-10-CM | POA: Diagnosis not present

## 2020-01-11 DIAGNOSIS — M7061 Trochanteric bursitis, right hip: Secondary | ICD-10-CM | POA: Diagnosis not present

## 2020-01-11 DIAGNOSIS — M25551 Pain in right hip: Secondary | ICD-10-CM

## 2020-01-11 MED ORDER — GABAPENTIN 100 MG PO CAPS: 200.0000 mg | ORAL_CAPSULE | Freq: Every day | ORAL | 3 refills | Status: AC

## 2020-01-11 NOTE — Assessment & Plan Note (Signed)
Patient is very active.  Discussed icing regimen and home exercises.  We discussed with patients with 2 monitor hip abductor strengthening.  Patient could differential includes a lumbar radiculopathy and given some gabapentin.  Patient does have fullness on the side of both hips but no masses appreciated on exam today or on ultrasound.  Continue to have pain is secondary to the longevity of this we would consider potentially advanced imaging but I would warrant more of physical therapy first if these injections do not seem to be helpful.  Patient is very active.  Follow-up again 6 weeks

## 2020-01-11 NOTE — Patient Instructions (Addendum)
Good to see you Pennsaid 2 times a day over the most painful spot 200 mg of Gabapentin at night Exercise 3 times a week for the hips  See me again in 5-6 weeks

## 2020-01-11 NOTE — Progress Notes (Signed)
[facility] [address] [place] Phone: [phone] Subjective:   I Joy Reid am serving as a Education administrator for Dr. Hulan Saas.  This visit occurred during the SARS-CoV-2 public health emergency.  Safety protocols were in place, including screening questions prior to the visit, additional usage of staff PPE, and extensive cleaning of exam room while observing appropriate contact time as indicated for disinfecting solutions.   I'm seeing this patient by the request  of:  Crecencio Mc, MD  CC: Bilateral hip pain  [id number]  Joy Reid is a 59 y.o. female coming in with complaint of bilateral hip pain. Right worse than left. Patient had xray recently. Sleeping on her sides are painful. Pain radiates down the leg. Has taken prednisone. 6-7/10 right 3/10 on the left at its worse. Has not received injections but believes she may need one. States prednisone did not help.  Patient states that it seems to give her discomfort fairly regularly.  Patient has been trying to avoid certain activities but still tries to stay active and works out regularly.  History of pelvic surgery with a hysterectomy and uterine prolapse.  No difficulty though with the postop care  Xray 12/31/2019 IMPRESSION: No acute osseous abnormality.      Past Medical History:  Diagnosis Date  . GERD (gastroesophageal reflux disease)   . Spontaneous pneumothorax    as a teenager, bilateral (sequential) s/p surgical stapling  . Spontaneous pneumothorax    as a teenager, bilateral (sequential) s/p surgical stapling   Past Surgical History:  Procedure Laterality Date  . AUGMENTATION MAMMAPLASTY Bilateral 1988  . [place]   x 3  . colpoplexy  05/2019  . LAPAROSCOPIC SUPRACERVICAL HYSTERECTOMY  05/2019  . LUNG SURGERY  1989   2nd lung stapling,  left side for recurrent PTX  . salpingectomy Bilateral 05/2019   Social History   Socioeconomic History    . Marital status: Married    Spouse name: Not on file  . Number of children: Not on file  . Years of education: Not on file  . Highest education level: Not on file  Occupational History  . Not on file  Tobacco Use  . Smoking status: Former Smoker    Quit date: 12/27/1983    Years since quitting: 36.0  . Smokeless tobacco: Never Used  Substance and Sexual Activity  . Alcohol use: Yes    Alcohol/week: 3.0 standard drinks    Types: 3 Cans of beer per week  . Drug use: No  . Sexual activity: Not on file  Other Topics Concern  . Not on file  Social History Narrative  . Not on file   Social Determinants of Health   Financial Resource Strain:   . Difficulty of Paying Living Expenses:   Food Insecurity:   . Worried About Charity fundraiser in the Last Year:   . Arboriculturist in the Last Year:   Transportation Needs:   . Film/video editor (Medical):   Marland Kitchen Lack of Transportation (Non-Medical):   Physical Activity:   . Days of Exercise per Week:   . Minutes of Exercise per Session:   Stress:   . Feeling of Stress :   Social Connections:   . Frequency of Communication with Friends and Family:   . Frequency of Social Gatherings with Friends and Family:   . Attends Religious Services:   . Active Member of Clubs or Organizations:   .  Attends Archivist Meetings:   Marland Kitchen Marital Status:    No Known Allergies Family History  Problem Relation Age of Onset  . Colon cancer Maternal Grandfather   . Cancer Neg Hx   . Hearing loss Neg Hx   . Heart disease Neg Hx   . Diabetes Neg Hx   . Early death Neg Hx   . Breast cancer Neg Hx     Current Outpatient Medications (Endocrine & Metabolic):  .  estradiol (ESTRACE) 2 MG tablet, Take 1 tablet (2 mg total) by mouth daily. .  predniSONE (DELTASONE) 10 MG tablet, 6 tablets on Day 1 , then reduce by 1 tablet daily until gone    Current Outpatient Medications (Analgesics):  .  celecoxib (CELEBREX) 200 MG capsule, Take 1  capsule (200 mg total) by mouth 2 (two) times daily. For one week,  Then once daily thereafter   Current Outpatient Medications (Other):  .  metroNIDAZOLE (METROGEL) 0.75 % vaginal gel, Place 1 Applicatorful vaginally at bedtime. For 5 days .  oxybutynin (DITROPAN-XL) 5 MG 24 hr tablet, Take 5 mg by mouth daily. .  valACYclovir (VALTREX) 500 MG tablet, Take 500 mg by mouth daily.   Reviewed prior external information including notes and imaging from  primary care provider As well as notes that were available from care everywhere and other healthcare systems.  Past medical history, social, surgical and family history all reviewed in electronic medical record.  No pertanent information unless stated regarding to the chief complaint.   Review of Systems:  No headache, visual changes, nausea, vomiting, diarrhea, constipation, dizziness, abdominal pain, skin rash, fevers, chills, night sweats, weight loss, swollen lymph nodes, body aches, joint swelling, chest pain, shortness of breath, mood changes. POSITIVE muscle aches  Objective  There were no vitals taken for this visit.   General: No apparent distress alert and oriented x3 mood and affect normal, dressed appropriately.  HEENT: Pupils equal, extraocular movements intact  Respiratory: Patient's speak in full sentences and does not appear short of breath  Cardiovascular: No lower extremity edema, non tender, no erythema  Neuro: Cranial nerves II through XII are intact, neurovascularly intact in all extremities with 2+ DTRs and 2+ pulses.  Gait normal with good balance and coordination.  MSK:  Non tender with full range of motion and good stability and symmetric strength and tone of shoulders, elbows, wrist, , knee and ankles bilaterally.  Hypermobility noted Bilateral hip show the patient does have some palpable greater trochanteric bursa's bilaterally right greater than left.  Tender to palpation.  Significant tightness with FABER test  bilaterally.  Good internal range of motion of the hips.  Negative straight leg test.  Minimal discomfort in the back itself.   Procedure: Real-time Ultrasound Guided Injection of right greater trochanteric bursitis secondary to patient's body habitus Device: GE Logiq Q7 Ultrasound guided injection is preferred based studies that show increased duration, increased effect, greater accuracy, decreased procedural pain, increased response rate, and decreased cost with ultrasound guided versus blind injection.  Verbal informed consent obtained.  Time-out conducted.  Noted no overlying erythema, induration, or other signs of local infection.  Skin prepped in a sterile fashion.  Local anesthesia: Topical Ethyl chloride.  With sterile technique and under real time ultrasound guidance:  Greater trochanteric area was visualized and patient's bursa was noted. A 22-gauge 3 inch needle was inserted and 4 cc of 0.5% Marcaine and 1 cc of Kenalog 40 mg/dL was injected. Pictures taken Completed without  difficulty  Pain immediately resolved suggesting accurate placement of the medication.  Advised to call if fevers/chills, erythema, induration, drainage, or persistent bleeding.  Images permanently stored and available for review in the ultrasound unit.  Impression: Technically successful ultrasound guided injection.   Procedure: Real-time Ultrasound Guided Injection of left  greater trochanteric bursitis secondary to patient's body habitus Device: GE Logiq Q7  Ultrasound guided injection is preferred based studies that show increased duration, increased effect, greater accuracy, decreased procedural pain, increased response rate, and decreased cost with ultrasound guided versus blind injection.  Verbal informed consent obtained.  Time-out conducted.  Noted no overlying erythema, induration, or other signs of local infection.  Skin prepped in a sterile fashion.  Local anesthesia: Topical Ethyl chloride.    With sterile technique and under real time ultrasound guidance:  Greater trochanteric area was visualized and patient's bursa was noted. A 22-gauge 3 inch needle was inserted and 4 cc of 0.5% Marcaine and 1 cc of Kenalog 40 mg/dL was injected. Pictures taken Completed without difficulty  Pain immediately resolved suggesting accurate placement of the medication.  Advised to call if fevers/chills, erythema, induration, drainage, or persistent bleeding.  Images permanently stored and available for review in the ultrasound unit.  Impression: Technically successful ultrasound guided injection.   Impression and Recommendations:     The above documentation has been reviewed and is accurate and complete Lyndal Pulley, DO       Note: This dictation was prepared with Dragon dictation along with smaller phrase technology. Any transcriptional errors that result from this process are unintentional.

## 2020-01-18 ENCOUNTER — Other Ambulatory Visit: Payer: Self-pay | Admitting: Internal Medicine

## 2020-01-18 DIAGNOSIS — N3001 Acute cystitis with hematuria: Secondary | ICD-10-CM

## 2020-01-18 DIAGNOSIS — N3000 Acute cystitis without hematuria: Secondary | ICD-10-CM | POA: Insufficient documentation

## 2020-01-18 MED ORDER — NITROFURANTOIN MONOHYD MACRO 100 MG PO CAPS: 100.0000 mg | ORAL_CAPSULE | Freq: Two times a day (BID) | ORAL | 0 refills | Status: DC

## 2020-01-18 NOTE — Assessment & Plan Note (Addendum)
Empiric therapy with Macrobid.  Culture needed if symptoms do not resolve.

## 2020-02-09 ENCOUNTER — Other Ambulatory Visit: Payer: Self-pay | Admitting: Internal Medicine

## 2020-02-09 DIAGNOSIS — Z1231 Encounter for screening mammogram for malignant neoplasm of breast: Secondary | ICD-10-CM

## 2020-02-16 DIAGNOSIS — Z03818 Encounter for observation for suspected exposure to other biological agents ruled out: Secondary | ICD-10-CM | POA: Diagnosis not present

## 2020-02-16 DIAGNOSIS — Z1152 Encounter for screening for COVID-19: Secondary | ICD-10-CM | POA: Diagnosis not present

## 2020-02-23 NOTE — Progress Notes (Signed)
[place] Phone: [phone] Subjective:   [name], am serving as a scribe for Dr. Hulan Saas. This visit occurred during the SARS-CoV-2 public health emergency.  Safety protocols were in place, including screening questions prior to the visit, additional usage of staff PPE, and extensive cleaning of exam room while observing appropriate contact time as indicated for disinfecting solutions.  I'm seeing this patient by the request  of:  Crecencio Mc, MD  CC: Hip pain follow-up  [id number]   01/11/2020 Patient is very active.  Discussed icing regimen and home exercises.  We discussed with patients with 2 monitor hip abductor strengthening.  Patient could differential includes a lumbar radiculopathy and given some gabapentin.  Patient does have fullness on the side of both hips but no masses appreciated on exam today or on ultrasound.  Continue to have pain is secondary to the longevity of this we would consider potentially advanced imaging but I would warrant more of physical therapy first if these injections do not seem to be helpful.  Patient is very active.  Follow-up again 6 weeks   Update 02/24/2020 Joy Reid is a 59 y.o. female coming in with complaint of bilateral knee pain. Injections given in [facility] last visit. Patient states that her pain is constant. Is able to work out but she pushes through pain. Pain with sleeping. Has had giving out sensation since we last saw her. R>L.  Patient continues to have discomfort and pain only on the right side though at this time.  Feels the left side is significantly better.  Seems to be worse at night     Past Medical History:  Diagnosis Date  . GERD (gastroesophageal reflux disease)   . Spontaneous pneumothorax    as a teenager, bilateral (sequential) s/p surgical stapling  . Spontaneous pneumothorax    as a teenager, bilateral (sequential) s/p surgical  stapling   Past Surgical History:  Procedure Laterality Date  . AUGMENTATION MAMMAPLASTY Bilateral 1988  . [place]   x 3  . colpoplexy  05/2019  . LAPAROSCOPIC SUPRACERVICAL HYSTERECTOMY  05/2019  . LUNG SURGERY  1989   2nd lung stapling,  left side for recurrent PTX  . salpingectomy Bilateral 05/2019   Social History   Socioeconomic History  . Marital status: Married    Spouse name: Not on file  . Number of children: Not on file  . Years of education: Not on file  . Highest education level: Not on file  Occupational History  . Not on file  Tobacco Use  . Smoking status: Former Smoker    Quit date: 12/27/1983    Years since quitting: 36.1  . Smokeless tobacco: Never Used  Substance and Sexual Activity  . Alcohol use: Yes    Alcohol/week: 3.0 standard drinks    Types: 3 Cans of beer per week  . Drug use: No  . Sexual activity: Not on file  Other Topics Concern  . Not on file  Social History Narrative  . Not on file   Social Determinants of Health   Financial Resource Strain:   . Difficulty of Paying Living Expenses: Not on file  Food Insecurity:   . Worried About Charity fundraiser in the Last Year: Not on file  . Ran Out of Food in the Last Year: Not on file  Transportation Needs:   . Lack of Transportation (Medical): Not on file  .  Lack of Transportation (Non-Medical): Not on file  Physical Activity:   . Days of Exercise per Week: Not on file  . Minutes of Exercise per Session: Not on file  Stress:   . Feeling of Stress : Not on file  Social Connections:   . Frequency of Communication with Friends and Family: Not on file  . Frequency of Social Gatherings with Friends and Family: Not on file  . Attends Religious Services: Not on file  . Active Member of Clubs or Organizations: Not on file  . Attends Archivist Meetings: Not on file  . Marital Status: Not on file   No Known Allergies Family History  Problem Relation Age of Onset    . Colon cancer Maternal Grandfather   . Cancer Neg Hx   . Hearing loss Neg Hx   . Heart disease Neg Hx   . Diabetes Neg Hx   . Early death Neg Hx   . Breast cancer Neg Hx     Current Outpatient Medications (Endocrine & Metabolic):  .  estradiol (ESTRACE) 2 MG tablet, Take 1 tablet (2 mg total) by mouth daily. .  predniSONE (DELTASONE) 10 MG tablet, 6 tablets on Day 1 , then reduce by 1 tablet daily until gone    Current Outpatient Medications (Analgesics):  .  celecoxib (CELEBREX) 200 MG capsule, Take 1 capsule (200 mg total) by mouth 2 (two) times daily. For one week,  Then once daily thereafter .  meloxicam (MOBIC) 7.5 MG tablet, Take 1 tablet (7.5 mg total) by mouth daily.   Current Outpatient Medications (Other):  .  gabapentin (NEURONTIN) 100 MG capsule, Take 2 capsules (200 mg total) by mouth at bedtime. .  metroNIDAZOLE (METROGEL) 0.75 % vaginal gel, Place 1 Applicatorful vaginally at bedtime. For 5 days .  nitrofurantoin, macrocrystal-monohydrate, (MACROBID) 100 MG capsule, Take 1 capsule (100 mg total) by mouth 2 (two) times daily. Marland Kitchen  oxybutynin (DITROPAN-XL) 5 MG 24 hr tablet, Take 5 mg by mouth daily. .  valACYclovir (VALTREX) 500 MG tablet, Take 500 mg by mouth daily.   Reviewed prior external information including notes and imaging from  primary care provider As well as notes that were available from care everywhere and other healthcare systems.  Past medical history, social, surgical and family history all reviewed in electronic medical record.  No pertanent information unless stated regarding to the chief complaint.   Review of Systems:  No headache, visual changes, nausea, vomiting, diarrhea, constipation, dizziness, abdominal pain, skin rash, fevers, chills, night sweats, weight loss, swollen lymph nodes, body aches, joint swelling, chest pain, shortness of breath, mood changes. POSITIVE muscle aches  Objective  Blood pressure 102/72, pulse 63, height 5\' 5"   (1.651 m), weight 111 lb (50.3 kg), SpO2 99 %.   General: No apparent distress alert and oriented x3 mood and affect normal, dressed appropriately.  HEENT: Pupils equal, extraocular movements intact  Respiratory: Patient's speak in full sentences and does not appear short of breath  Cardiovascular: No lower extremity edema, non tender, no erythema  Neuro: Cranial nerves II through XII are intact, neurovascularly intact in all extremities with 2+ DTRs and 2+ pulses.  Gait normal with good balance and coordination.  MSK: Right hip shows some tenderness to palpation over the greater trochanteric area and the most distal aspect of the gluteal tendon.  Patient does have what appears to be more midline lipoma in the area.  Mild tightness with Corky Sox on the right compared to the left.  Negative straight leg test.    Impression and Recommendations:     The above documentation has been reviewed and is accurate and complete Lyndal Pulley, DO       Note: This dictation was prepared with Dragon dictation along with smaller phrase technology. Any transcriptional errors that result from this process are unintentional.

## 2020-02-24 ENCOUNTER — Other Ambulatory Visit: Payer: Self-pay

## 2020-02-24 ENCOUNTER — Ambulatory Visit (INDEPENDENT_AMBULATORY_CARE_PROVIDER_SITE_OTHER): Payer: 59

## 2020-02-24 ENCOUNTER — Ambulatory Visit: Payer: 59 | Admitting: Family Medicine

## 2020-02-24 ENCOUNTER — Encounter: Payer: Self-pay | Admitting: Family Medicine

## 2020-02-24 VITALS — BP 102/72 | HR 63 | Ht 65.0 in | Wt 111.0 lb

## 2020-02-24 DIAGNOSIS — M545 Low back pain, unspecified: Secondary | ICD-10-CM

## 2020-02-24 DIAGNOSIS — M7061 Trochanteric bursitis, right hip: Secondary | ICD-10-CM | POA: Diagnosis not present

## 2020-02-24 DIAGNOSIS — M7062 Trochanteric bursitis, left hip: Secondary | ICD-10-CM | POA: Diagnosis not present

## 2020-02-24 MED ORDER — MELOXICAM 7.5 MG PO TABS
7.5000 mg | ORAL_TABLET | Freq: Every day | ORAL | 0 refills | Status: DC
Start: 2020-02-24 — End: 2021-03-06

## 2020-02-24 NOTE — Assessment & Plan Note (Signed)
Chronic problem with continued exacerbation patient is not taking Celebrex and we will start her on a very low-dose of meloxicam for short burst.  Warned of potential side effects in the exacerbation of the esophageal reflux disease.  Difficult encouraged patient will stop immediately.  Encourage the gabapentin, x-rays of the back ordered as well for the further evaluation of the differential.  Patient will also start with formal physical therapy.  Come back in 4 to 6 weeks and if continuing pain I would like to consider 1 more injection on the lateral aspect of the hip before examination.

## 2020-02-24 NOTE — Patient Instructions (Signed)
Meloxicam 7.5 daily for 2 weeks then at needed - Do not use NSAIDS such as Advil or Aleve when taking Meloxicam It is ok to use Tylenol for additional pain relief  PT at [facility] before bed Xray back on way out Continue exercises See me in 4-6 weeks and we will consider another injection

## 2020-03-08 ENCOUNTER — Ambulatory Visit: Payer: 59

## 2020-03-09 DIAGNOSIS — M25551 Pain in right hip: Secondary | ICD-10-CM | POA: Diagnosis not present

## 2020-03-20 DIAGNOSIS — M25551 Pain in right hip: Secondary | ICD-10-CM | POA: Diagnosis not present

## 2020-03-22 DIAGNOSIS — M25551 Pain in right hip: Secondary | ICD-10-CM | POA: Diagnosis not present

## 2020-03-28 DIAGNOSIS — M25551 Pain in right hip: Secondary | ICD-10-CM | POA: Diagnosis not present

## 2020-03-29 ENCOUNTER — Other Ambulatory Visit: Payer: Self-pay

## 2020-03-29 ENCOUNTER — Ambulatory Visit: Payer: 59 | Admitting: Family Medicine

## 2020-03-29 ENCOUNTER — Ambulatory Visit
Admission: RE | Admit: 2020-03-29 | Discharge: 2020-03-29 | Disposition: A | Discharge status: Home / Self Care | Payer: 59 | Source: Ambulatory Visit | Attending: Internal Medicine | Admitting: Internal Medicine

## 2020-03-29 DIAGNOSIS — Z1231 Encounter for screening mammogram for malignant neoplasm of breast: Secondary | ICD-10-CM

## 2020-03-30 DIAGNOSIS — M25551 Pain in right hip: Secondary | ICD-10-CM | POA: Diagnosis not present

## 2020-04-03 DIAGNOSIS — M25551 Pain in right hip: Secondary | ICD-10-CM | POA: Diagnosis not present

## 2020-04-04 ENCOUNTER — Other Ambulatory Visit: Payer: Self-pay | Admitting: Internal Medicine

## 2020-04-04 ENCOUNTER — Ambulatory Visit: Payer: 59 | Attending: Internal Medicine

## 2020-04-04 DIAGNOSIS — Z23 Encounter for immunization: Secondary | ICD-10-CM

## 2020-04-04 NOTE — Progress Notes (Signed)
° °  Covid-19 Vaccination Clinic  Name:  Joy Reid    MRN: [id number] DOB: 04/03/1961  04/04/2020  [name] was observed post Covid-19 immunization for 15 minutes without incident. She was provided with Vaccine Information Sheet and instruction to access the V-Safe system.   [name] was instructed to call 911 with any severe reactions post vaccine:  Difficulty breathing   Swelling of face and throat   A fast heartbeat   A bad rash all over body   Dizziness and weakness

## 2020-04-05 DIAGNOSIS — M25551 Pain in right hip: Secondary | ICD-10-CM | POA: Diagnosis not present

## 2020-04-10 ENCOUNTER — Other Ambulatory Visit: Payer: Self-pay | Admitting: Internal Medicine

## 2020-04-10 DIAGNOSIS — E785 Hyperlipidemia, unspecified: Secondary | ICD-10-CM

## 2020-04-10 DIAGNOSIS — R5383 Other fatigue: Secondary | ICD-10-CM

## 2020-04-10 DIAGNOSIS — E559 Vitamin D deficiency, unspecified: Secondary | ICD-10-CM

## 2020-04-10 DIAGNOSIS — M62830 Muscle spasm of back: Secondary | ICD-10-CM | POA: Diagnosis not present

## 2020-04-10 DIAGNOSIS — M25551 Pain in right hip: Secondary | ICD-10-CM | POA: Diagnosis not present

## 2020-04-10 DIAGNOSIS — M9903 Segmental and somatic dysfunction of lumbar region: Secondary | ICD-10-CM | POA: Diagnosis not present

## 2020-04-10 DIAGNOSIS — M9904 Segmental and somatic dysfunction of sacral region: Secondary | ICD-10-CM | POA: Diagnosis not present

## 2020-04-10 DIAGNOSIS — M5441 Lumbago with sciatica, right side: Secondary | ICD-10-CM | POA: Diagnosis not present

## 2020-04-11 ENCOUNTER — Other Ambulatory Visit (INDEPENDENT_AMBULATORY_CARE_PROVIDER_SITE_OTHER): Payer: 59

## 2020-04-11 ENCOUNTER — Other Ambulatory Visit: Payer: Self-pay | Admitting: Internal Medicine

## 2020-04-11 ENCOUNTER — Other Ambulatory Visit: Payer: Self-pay

## 2020-04-11 DIAGNOSIS — R5383 Other fatigue: Secondary | ICD-10-CM | POA: Diagnosis not present

## 2020-04-11 DIAGNOSIS — E785 Hyperlipidemia, unspecified: Secondary | ICD-10-CM | POA: Diagnosis not present

## 2020-04-11 DIAGNOSIS — E559 Vitamin D deficiency, unspecified: Secondary | ICD-10-CM | POA: Diagnosis not present

## 2020-04-11 DIAGNOSIS — G629 Polyneuropathy, unspecified: Secondary | ICD-10-CM

## 2020-04-11 LAB — LIPID PANEL
Cholesterol: 232 mg/dL — ABNORMAL HIGH (ref 0–200)
HDL: 82.8 mg/dL (ref >39.00)
LDL Cholesterol: 128 mg/dL — ABNORMAL HIGH (ref 0–99)
NonHDL: 148.87
Total CHOL/HDL Ratio: 3
Triglycerides: 106 mg/dL (ref 0.0–149.0)
VLDL: 21.2 mg/dL (ref 0.0–40.0)

## 2020-04-11 LAB — COMPREHENSIVE METABOLIC PANEL
ALT: 38 U/L — ABNORMAL HIGH (ref 0–35)
AST: 27 U/L (ref 0–37)
Albumin: 4.4 g/dL (ref 3.5–5.2)
Alkaline Phosphatase: 41 U/L (ref 39–117)
BUN: 15 mg/dL (ref 6–23)
CO2: 30 mEq/L (ref 19–32)
Calcium: 9 mg/dL (ref 8.4–10.5)
Chloride: 102 mEq/L (ref 96–112)
Creatinine, Ser: 0.59 mg/dL (ref 0.40–1.20)
GFR: 97.11 mL/min (ref >60.00)
Glucose, Bld: 88 mg/dL (ref 70–99)
Potassium: 4.2 mEq/L (ref 3.5–5.1)
Sodium: 137 mEq/L (ref 135–145)
Total Bilirubin: 0.6 mg/dL (ref 0.2–1.2)
Total Protein: 6.1 g/dL (ref 6.0–8.3)

## 2020-04-11 LAB — CBC WITH DIFFERENTIAL/PLATELET
Basophils Absolute: 0.1 10*3/uL (ref 0.0–0.1)
Basophils Relative: 1.3 % (ref 0.0–3.0)
Eosinophils Absolute: 0.1 10*3/uL (ref 0.0–0.7)
Eosinophils Relative: 0.9 % (ref 0.0–5.0)
HCT: 42.9 % (ref 36.0–46.0)
Hemoglobin: 14.3 g/dL (ref 12.0–15.0)
Lymphocytes Relative: 22.2 % (ref 12.0–46.0)
Lymphs Abs: 1.5 10*3/uL (ref 0.7–4.0)
MCHC: 33.3 g/dL (ref 30.0–36.0)
MCV: 93.1 fl (ref 78.0–100.0)
Monocytes Absolute: 0.4 10*3/uL (ref 0.1–1.0)
Monocytes Relative: 5.9 % (ref 3.0–12.0)
Neutro Abs: 4.8 10*3/uL (ref 1.4–7.7)
Neutrophils Relative %: 69.7 % (ref 43.0–77.0)
Platelets: 247 10*3/uL (ref 150.0–400.0)
RBC: 4.61 Mil/uL (ref 3.87–5.11)
RDW: 12.5 % (ref 11.5–15.5)
WBC: 6.9 10*3/uL (ref 4.0–10.5)

## 2020-04-11 LAB — TSH: TSH: 0.93 u[IU]/mL (ref 0.35–4.50)

## 2020-04-11 LAB — VITAMIN D 25 HYDROXY (VIT D DEFICIENCY, FRACTURES): VITD: 18.13 ng/mL — ABNORMAL LOW (ref 30.00–100.00)

## 2020-04-11 LAB — HEMOGLOBIN A1C: Hgb A1c MFr Bld: 5.7 % (ref 4.6–6.5)

## 2020-04-11 NOTE — Addendum Note (Signed)
Addended by: Tor Netters I on: 04/11/2020 09:42 AM   Modules accepted: Orders

## 2020-04-12 ENCOUNTER — Other Ambulatory Visit: Payer: Self-pay | Admitting: Internal Medicine

## 2020-04-12 DIAGNOSIS — R7401 Elevation of levels of liver transaminase levels: Secondary | ICD-10-CM

## 2020-04-12 DIAGNOSIS — M25551 Pain in right hip: Secondary | ICD-10-CM | POA: Diagnosis not present

## 2020-04-12 MED ORDER — ERGOCALCIFEROL 1.25 MG (50000 UT) PO CAPS: 50000.0000 [IU] | ORAL_CAPSULE | ORAL | 0 refills | Status: DC

## 2020-04-12 NOTE — Progress Notes (Signed)
You do not have diabetes , prediabetes, anemia, or thyroid disease.  Your cholesterol is fine., but one of your liver enzymes is mildly elevated  for unclear reasons.   I recommend that you return  for a repeat test in 2 weeks. (viral infections ,  Vigorous exercise can cause transient elevations.) You do not need to fast.      Your vitamin D is low again.   I am calling in a megadose of Vit D to take once weekly for a total of 3 months.  After you finish the weekly Vitamin D supplement, you should continue supplementing with an  OTC  Vit D3 supplement  2000 Ius daily  through April.

## 2020-04-13 DIAGNOSIS — M9904 Segmental and somatic dysfunction of sacral region: Secondary | ICD-10-CM | POA: Diagnosis not present

## 2020-04-13 DIAGNOSIS — M62830 Muscle spasm of back: Secondary | ICD-10-CM | POA: Diagnosis not present

## 2020-04-13 DIAGNOSIS — M9903 Segmental and somatic dysfunction of lumbar region: Secondary | ICD-10-CM | POA: Diagnosis not present

## 2020-04-13 DIAGNOSIS — M5441 Lumbago with sciatica, right side: Secondary | ICD-10-CM | POA: Diagnosis not present

## 2020-04-18 DIAGNOSIS — M25551 Pain in right hip: Secondary | ICD-10-CM | POA: Diagnosis not present

## 2020-04-25 DIAGNOSIS — M5441 Lumbago with sciatica, right side: Secondary | ICD-10-CM | POA: Diagnosis not present

## 2020-04-25 DIAGNOSIS — M9904 Segmental and somatic dysfunction of sacral region: Secondary | ICD-10-CM | POA: Diagnosis not present

## 2020-04-25 DIAGNOSIS — M9903 Segmental and somatic dysfunction of lumbar region: Secondary | ICD-10-CM | POA: Diagnosis not present

## 2020-04-25 DIAGNOSIS — M62830 Muscle spasm of back: Secondary | ICD-10-CM | POA: Diagnosis not present

## 2020-04-25 DIAGNOSIS — M25551 Pain in right hip: Secondary | ICD-10-CM | POA: Diagnosis not present

## 2020-05-10 DIAGNOSIS — M25551 Pain in right hip: Secondary | ICD-10-CM | POA: Diagnosis not present

## 2020-05-10 DIAGNOSIS — M9904 Segmental and somatic dysfunction of sacral region: Secondary | ICD-10-CM | POA: Diagnosis not present

## 2020-05-10 DIAGNOSIS — M9903 Segmental and somatic dysfunction of lumbar region: Secondary | ICD-10-CM | POA: Diagnosis not present

## 2020-05-10 DIAGNOSIS — M62830 Muscle spasm of back: Secondary | ICD-10-CM | POA: Diagnosis not present

## 2020-05-10 DIAGNOSIS — M5441 Lumbago with sciatica, right side: Secondary | ICD-10-CM | POA: Diagnosis not present

## 2020-05-11 DIAGNOSIS — M9904 Segmental and somatic dysfunction of sacral region: Secondary | ICD-10-CM | POA: Diagnosis not present

## 2020-05-11 DIAGNOSIS — M9903 Segmental and somatic dysfunction of lumbar region: Secondary | ICD-10-CM | POA: Diagnosis not present

## 2020-05-11 DIAGNOSIS — M5441 Lumbago with sciatica, right side: Secondary | ICD-10-CM | POA: Diagnosis not present

## 2020-05-11 DIAGNOSIS — M62830 Muscle spasm of back: Secondary | ICD-10-CM | POA: Diagnosis not present

## 2020-05-16 DIAGNOSIS — M9903 Segmental and somatic dysfunction of lumbar region: Secondary | ICD-10-CM | POA: Diagnosis not present

## 2020-05-16 DIAGNOSIS — M62830 Muscle spasm of back: Secondary | ICD-10-CM | POA: Diagnosis not present

## 2020-05-16 DIAGNOSIS — M5441 Lumbago with sciatica, right side: Secondary | ICD-10-CM | POA: Diagnosis not present

## 2020-05-16 DIAGNOSIS — M9904 Segmental and somatic dysfunction of sacral region: Secondary | ICD-10-CM | POA: Diagnosis not present

## 2020-05-24 DIAGNOSIS — M9903 Segmental and somatic dysfunction of lumbar region: Secondary | ICD-10-CM | POA: Diagnosis not present

## 2020-05-24 DIAGNOSIS — M5441 Lumbago with sciatica, right side: Secondary | ICD-10-CM | POA: Diagnosis not present

## 2020-05-24 DIAGNOSIS — M9904 Segmental and somatic dysfunction of sacral region: Secondary | ICD-10-CM | POA: Diagnosis not present

## 2020-05-24 DIAGNOSIS — M62830 Muscle spasm of back: Secondary | ICD-10-CM | POA: Diagnosis not present

## 2020-05-30 ENCOUNTER — Other Ambulatory Visit: Payer: Self-pay | Admitting: Internal Medicine

## 2020-05-30 DIAGNOSIS — N3001 Acute cystitis with hematuria: Secondary | ICD-10-CM

## 2020-05-30 DIAGNOSIS — M9904 Segmental and somatic dysfunction of sacral region: Secondary | ICD-10-CM | POA: Diagnosis not present

## 2020-05-30 DIAGNOSIS — M62830 Muscle spasm of back: Secondary | ICD-10-CM | POA: Diagnosis not present

## 2020-05-30 DIAGNOSIS — M5441 Lumbago with sciatica, right side: Secondary | ICD-10-CM | POA: Diagnosis not present

## 2020-05-30 DIAGNOSIS — M9903 Segmental and somatic dysfunction of lumbar region: Secondary | ICD-10-CM | POA: Diagnosis not present

## 2020-05-30 MED ORDER — CIPROFLOXACIN HCL 250 MG PO TABS: 250.0000 mg | ORAL_TABLET | Freq: Two times a day (BID) | ORAL | 0 refills | Status: DC

## 2020-05-30 NOTE — Assessment & Plan Note (Signed)
With gross hematuria.  Empiric cipro called in .  Will need urinalysis and culture if does not resolve.

## 2020-06-01 DIAGNOSIS — M62830 Muscle spasm of back: Secondary | ICD-10-CM | POA: Diagnosis not present

## 2020-06-01 DIAGNOSIS — M9903 Segmental and somatic dysfunction of lumbar region: Secondary | ICD-10-CM | POA: Diagnosis not present

## 2020-06-01 DIAGNOSIS — M9904 Segmental and somatic dysfunction of sacral region: Secondary | ICD-10-CM | POA: Diagnosis not present

## 2020-06-01 DIAGNOSIS — M5441 Lumbago with sciatica, right side: Secondary | ICD-10-CM | POA: Diagnosis not present

## 2020-06-06 DIAGNOSIS — M9903 Segmental and somatic dysfunction of lumbar region: Secondary | ICD-10-CM | POA: Diagnosis not present

## 2020-06-06 DIAGNOSIS — M9904 Segmental and somatic dysfunction of sacral region: Secondary | ICD-10-CM | POA: Diagnosis not present

## 2020-06-06 DIAGNOSIS — M5441 Lumbago with sciatica, right side: Secondary | ICD-10-CM | POA: Diagnosis not present

## 2020-06-06 DIAGNOSIS — M62830 Muscle spasm of back: Secondary | ICD-10-CM | POA: Diagnosis not present

## 2020-06-09 ENCOUNTER — Encounter: Payer: Self-pay | Admitting: Internal Medicine

## 2020-06-09 DIAGNOSIS — U071 COVID-19: Secondary | ICD-10-CM | POA: Insufficient documentation

## 2020-06-09 DIAGNOSIS — Z03818 Encounter for observation for suspected exposure to other biological agents ruled out: Secondary | ICD-10-CM | POA: Diagnosis not present

## 2020-06-20 DIAGNOSIS — N393 Stress incontinence (female) (male): Secondary | ICD-10-CM | POA: Diagnosis not present

## 2020-08-09 DIAGNOSIS — H11002 Unspecified pterygium of left eye: Secondary | ICD-10-CM | POA: Diagnosis not present

## 2020-08-30 DIAGNOSIS — H16223 Keratoconjunctivitis sicca, not specified as Sjogren's, bilateral: Secondary | ICD-10-CM | POA: Diagnosis not present

## 2020-09-11 ENCOUNTER — Telehealth: Payer: Self-pay | Admitting: Internal Medicine

## 2020-09-11 ENCOUNTER — Other Ambulatory Visit: Payer: Self-pay

## 2020-09-11 NOTE — Telephone Encounter (Signed)
Immunization report has been printed and placed up front for pt to pick up. Left detailed message letting pt know that it is ready for pick up.

## 2020-09-11 NOTE — Telephone Encounter (Signed)
Pt called and wanted to get a copy of her Covid vaccines  Pt is needing to get a replacement card

## 2020-10-11 DIAGNOSIS — I8311 Varicose veins of right lower extremity with inflammation: Secondary | ICD-10-CM | POA: Diagnosis not present

## 2020-11-21 DIAGNOSIS — I8311 Varicose veins of right lower extremity with inflammation: Secondary | ICD-10-CM | POA: Diagnosis not present

## 2020-11-22 DIAGNOSIS — M9903 Segmental and somatic dysfunction of lumbar region: Secondary | ICD-10-CM | POA: Diagnosis not present

## 2020-11-22 DIAGNOSIS — M9907 Segmental and somatic dysfunction of upper extremity: Secondary | ICD-10-CM | POA: Diagnosis not present

## 2020-11-22 DIAGNOSIS — M7711 Lateral epicondylitis, right elbow: Secondary | ICD-10-CM | POA: Diagnosis not present

## 2020-11-29 DIAGNOSIS — I8312 Varicose veins of left lower extremity with inflammation: Secondary | ICD-10-CM | POA: Diagnosis not present

## 2020-11-29 DIAGNOSIS — I8311 Varicose veins of right lower extremity with inflammation: Secondary | ICD-10-CM | POA: Diagnosis not present

## 2020-12-19 DIAGNOSIS — I8312 Varicose veins of left lower extremity with inflammation: Secondary | ICD-10-CM | POA: Diagnosis not present

## 2020-12-31 IMAGING — MG DIGITAL SCREENING BREAST BILAT IMPLANT W/ TOMO W/ CAD
9 of 12 series · 9 of 28 positions shown · non-contrast
Comparison: Previous exam(s).

CLINICAL DATA: Screening.

EXAM:
DIGITAL SCREENING BILATERAL MAMMOGRAM WITH IMPLANTS, CAD AND TOMO
The patient has retropectoral implants. Standard and implant
displaced views were performed.

[R MLO]
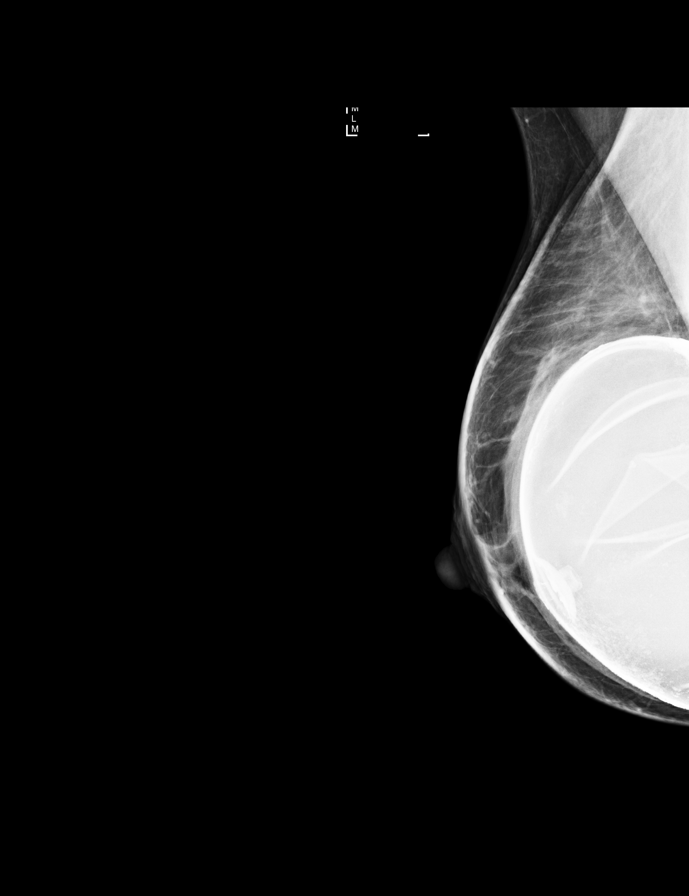

[R CC]
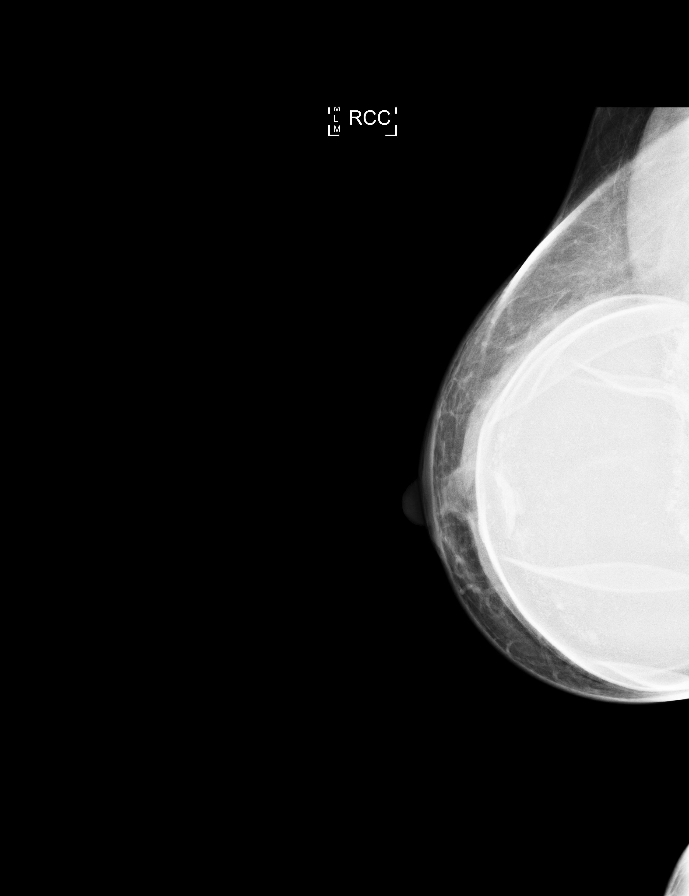

[L MLO]
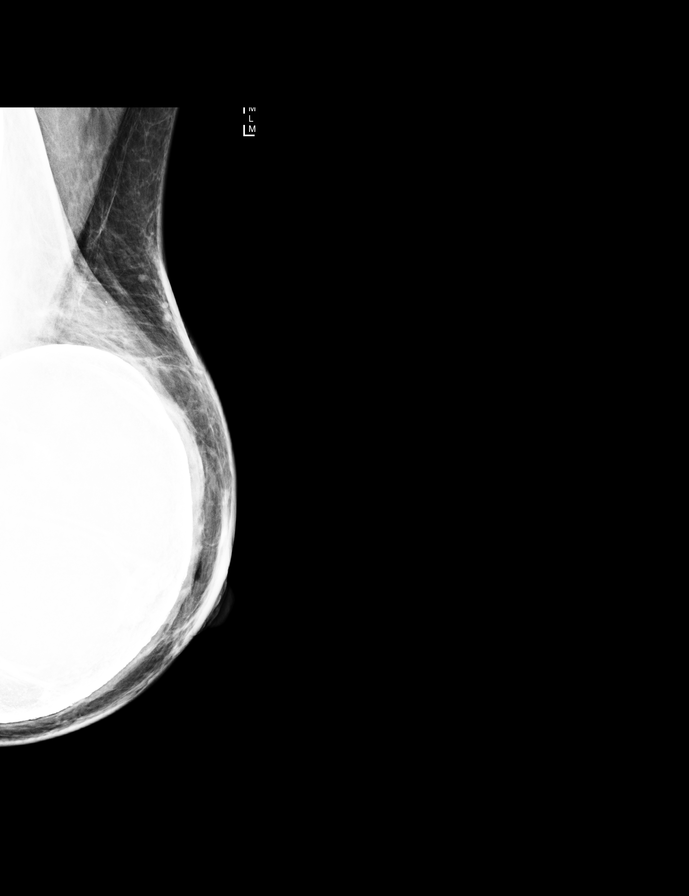

[L CC]
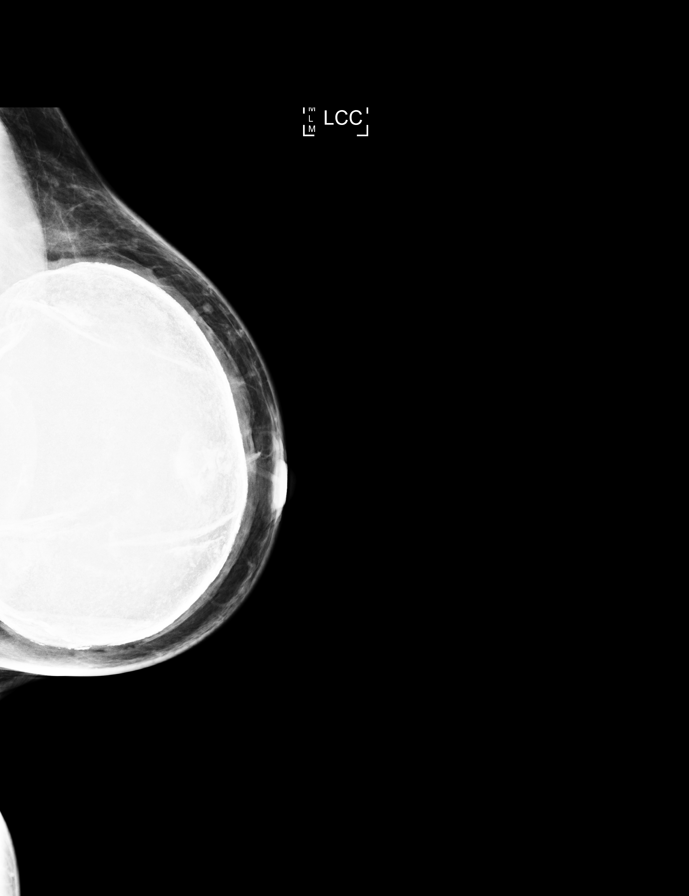

[L CC synth-2D]
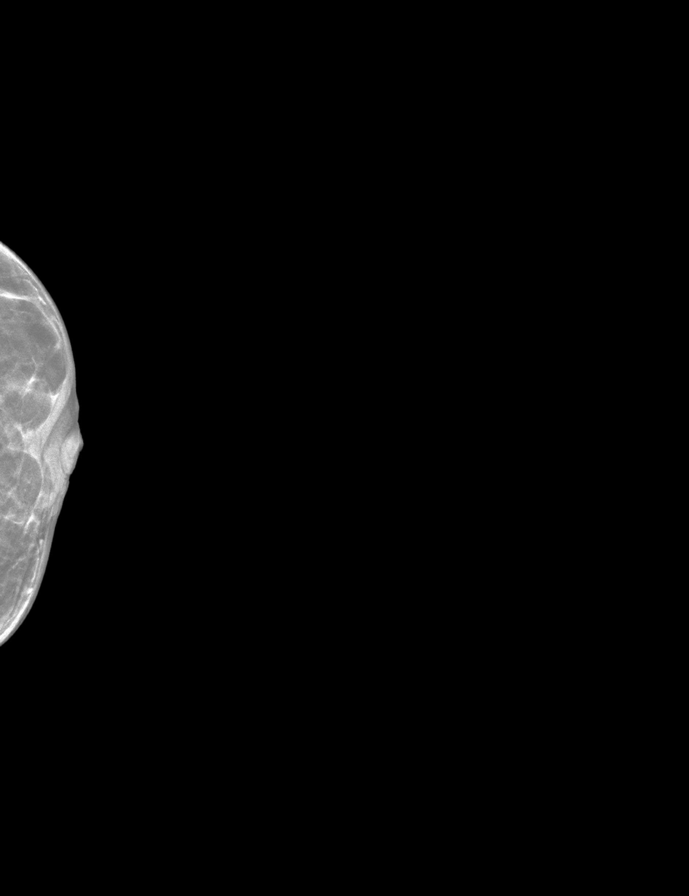

[R MLO synth-2D]
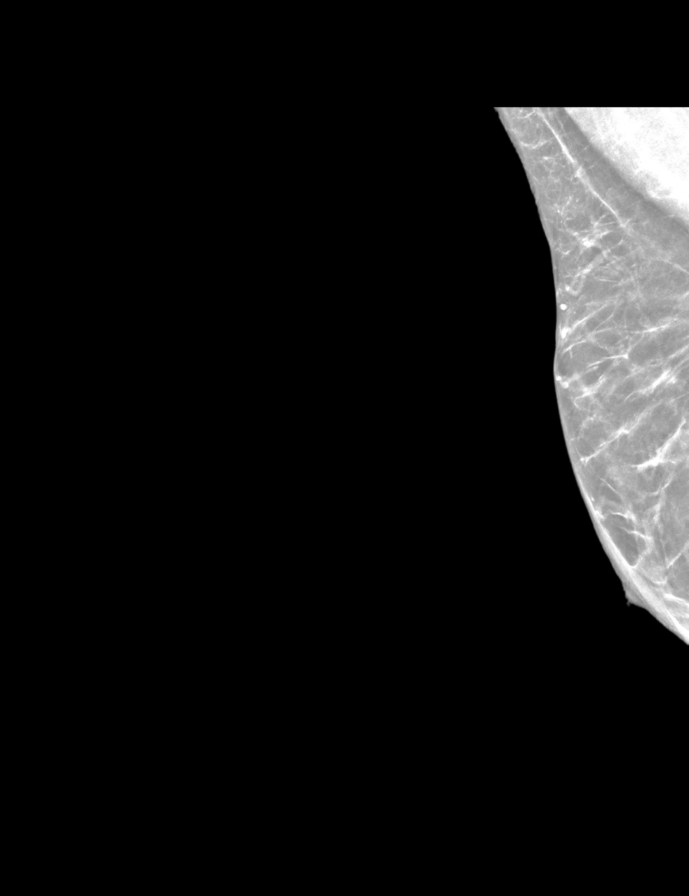

[R CC synth-2D]
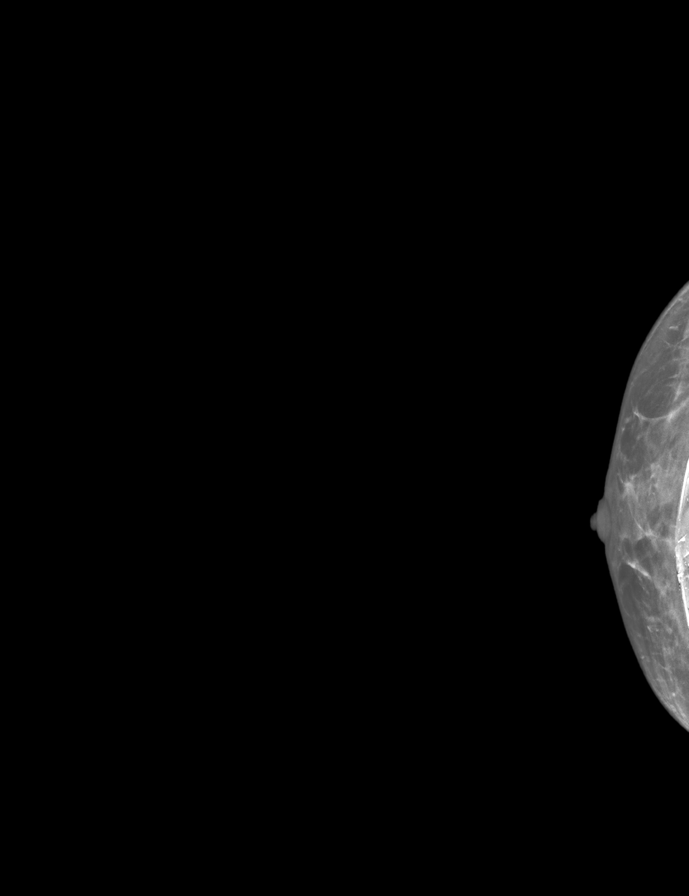

[L MLO synth-2D]
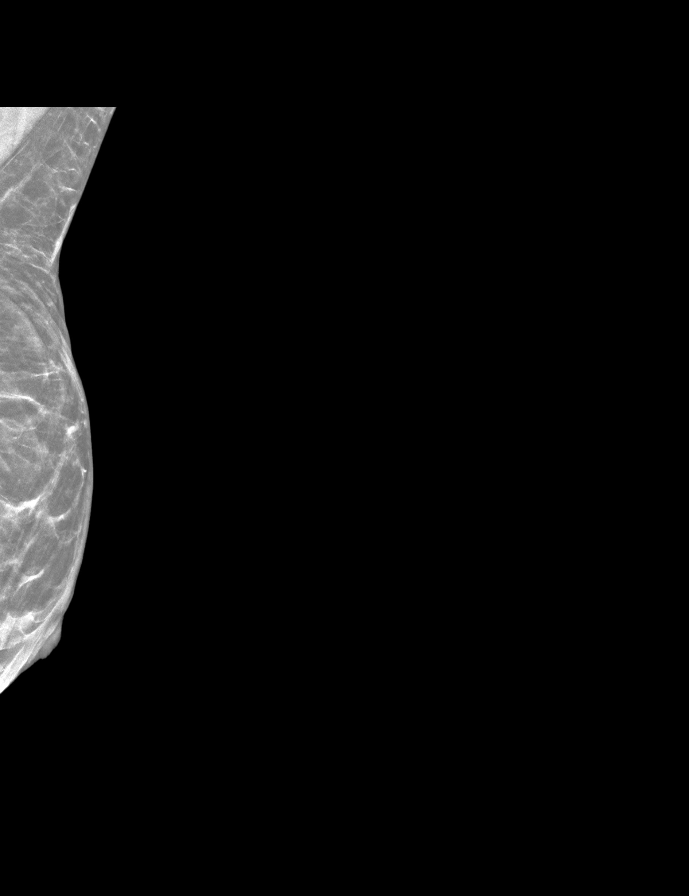

[L CCID BREAST TOMOSYNTHESIS IMAGE tomo · tomo slice 10/19.0]
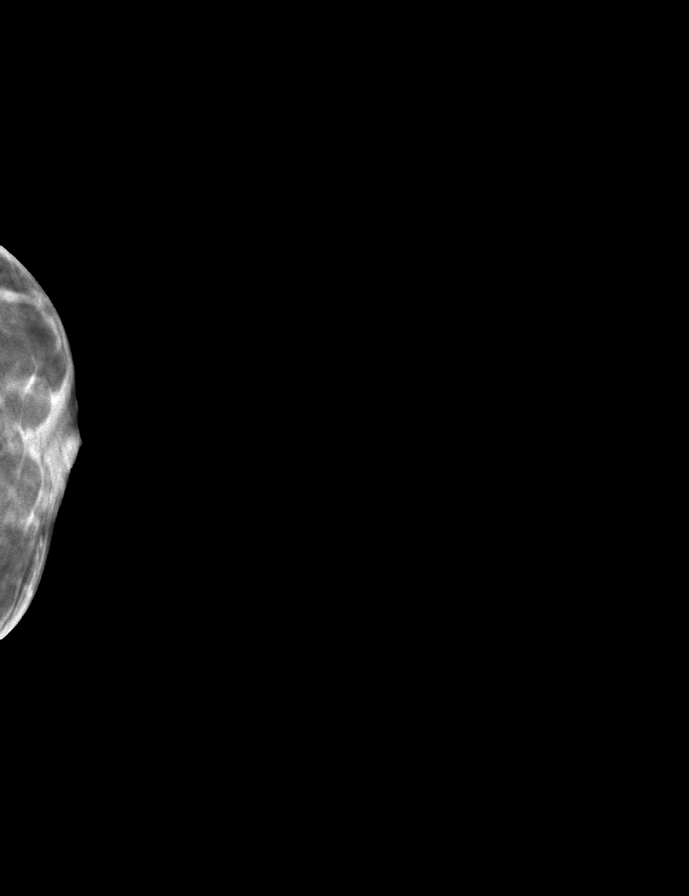

[9 of 28 positions shown; findings below may reference images not displayed]

ACR Breast Density Category b: There are scattered areas of
fibroglandular density.
FINDINGS: There are no findings suspicious for malignancy. Images were
processed with CAD.
IMPRESSION: No mammographic evidence of malignancy. A result letter of this
screening mammogram will be mailed directly to the patient.

RECOMMENDATION:
Screening mammogram in one year. (Code:60-T-8Z4)

BI-RADS CATEGORY  1:  Negative.

## 2021-01-04 DIAGNOSIS — M7711 Lateral epicondylitis, right elbow: Secondary | ICD-10-CM | POA: Diagnosis not present

## 2021-01-04 DIAGNOSIS — M25521 Pain in right elbow: Secondary | ICD-10-CM | POA: Diagnosis not present

## 2021-01-11 DIAGNOSIS — M7711 Lateral epicondylitis, right elbow: Secondary | ICD-10-CM | POA: Diagnosis not present

## 2021-01-16 ENCOUNTER — Other Ambulatory Visit: Payer: Self-pay | Admitting: Internal Medicine

## 2021-01-16 DIAGNOSIS — Z136 Encounter for screening for cardiovascular disorders: Secondary | ICD-10-CM

## 2021-02-02 DIAGNOSIS — Z01419 Encounter for gynecological examination (general) (routine) without abnormal findings: Secondary | ICD-10-CM | POA: Diagnosis not present

## 2021-02-02 DIAGNOSIS — N951 Menopausal and female climacteric states: Secondary | ICD-10-CM | POA: Diagnosis not present

## 2021-02-14 DIAGNOSIS — I8311 Varicose veins of right lower extremity with inflammation: Secondary | ICD-10-CM | POA: Diagnosis not present

## 2021-03-06 ENCOUNTER — Encounter: Payer: Self-pay | Admitting: Cardiovascular Disease

## 2021-03-06 ENCOUNTER — Other Ambulatory Visit: Payer: Self-pay

## 2021-03-06 ENCOUNTER — Ambulatory Visit: Payer: 59 | Admitting: Cardiovascular Disease

## 2021-03-06 VITALS — BP 110/60 | HR 65 | Ht 65.0 in | Wt 121.0 lb

## 2021-03-06 DIAGNOSIS — Z13220 Encounter for screening for lipoid disorders: Secondary | ICD-10-CM

## 2021-03-06 DIAGNOSIS — Z0000 Encounter for general adult medical examination without abnormal findings: Secondary | ICD-10-CM

## 2021-03-06 DIAGNOSIS — M25561 Pain in right knee: Secondary | ICD-10-CM | POA: Diagnosis not present

## 2021-03-06 DIAGNOSIS — Z8342 Family history of familial hypercholesterolemia: Secondary | ICD-10-CM

## 2021-03-06 NOTE — Progress Notes (Addendum)
Cardiology Office Note  Date:  03/06/2021   ID:  Joy, Reid 06/20/1960, MRN [id number]  PCP:  Crecencio Mc, MD   Chief Complaint  Patient presents with   New Patient (Initial Visit)    Patient would like a calcium score - Meds reviewed verbally with patient.     HPI:  [name] is a 60 year old woman with past medical history of Hyperlipidemia status post Robotic-assisted laparoscopic,supracervical hysterectomy, bilateral salpingectomy, mesh sacrocolpopexy, mesh retropubic midurethral sling) surgery on 05/14/2019 Who presents for routine follow-up of her hyperlipidemia, cardiac risk factors  Prior history reviewed CT coronary calcium scoring June 2017 Score of 0  Reports that she works out, eats healthy, weight is stable Non-smoker, no diabetes No chest pain or shortness of breath concerning for angina  Prior history hyperlipidemia, 6 years ago total cholesterol 233  Lab work reviewed from 10 months ago A1c 5.7 Total cholesterol 232, LDL 128 ALT 38  EKG personally reviewed by myself on todays visit Normal sinus rhythm rate 65 bpm no significant ST-T wave changes   PMH:   has a past medical history of GERD (gastroesophageal reflux disease), Spontaneous pneumothorax, and Spontaneous pneumothorax.  PSH:    Past Surgical History:  Procedure Laterality Date   AUGMENTATION MAMMAPLASTY Bilateral [facility]   x 3   colpoplexy  05/2019   LAPAROSCOPIC SUPRACERVICAL HYSTERECTOMY  05/2019   LUNG SURGERY  1989   2nd lung stapling,  left side for recurrent PTX   salpingectomy Bilateral 05/2019    Current Outpatient Medications  Medication Sig Dispense Refill   COVID-19 mRNA vaccine, Pfizer, 30 MCG/0.3ML injection USE AS DIRECTED .3 mL 0   ergocalciferol (DRISDOL) 1.25 MG (50000 UT) capsule Take 1 capsule (50,000 Units total) by mouth once a week. 12 capsule 0   estradiol (ESTRACE) 2 MG tablet Take 1 tablet (2 mg total) by mouth daily. 30  tablet 5   gabapentin (NEURONTIN) 100 MG capsule Take 2 capsules (200 mg total) by mouth at bedtime. 180 capsule 3   No current facility-administered medications for this visit.    Allergies:   Patient has no known allergies.   Social History:  The patient  reports that she quit smoking about 37 years ago. She has never used smokeless tobacco. She reports current alcohol use of about 3.0 standard drinks per week. She reports that she does not use drugs.   Family History:   family history includes Colon cancer in her maternal grandfather.    Review of Systems: Review of Systems  Constitutional: Negative.   HENT: Negative.    Respiratory: Negative.    Cardiovascular: Negative.   Gastrointestinal: Negative.   Musculoskeletal: Negative.   Neurological: Negative.   Psychiatric/Behavioral: Negative.    All other systems reviewed and are negative.   PHYSICAL EXAM: VS:  BP 110/60 (BP Location: Left Arm, Patient Position: Sitting, Cuff Size: Normal)   Pulse 65   Ht 5\' 5"  (1.651 m)   Wt 121 lb (54.9 kg)   SpO2 98%   BMI 20.14 kg/m  , BMI Body mass index is 20.14 kg/m. GEN: Well nourished, well developed, in no acute distress HEENT: normal Neck: no JVD, carotid bruits, or masses Cardiac: RRR; no murmurs, rubs, or gallops,no edema  Respiratory:  clear to auscultation bilaterally, normal work of breathing GI: soft, nontender, nondistended, + BS MS: no deformity or atrophy Skin: warm and dry, no rash Neuro:  Strength and sensation are intact  Psych: euthymic mood, full affect   Recent Labs: 04/11/2020: ALT 38; BUN 15; Creatinine, Ser 0.59; Hemoglobin 14.3; Platelets 247.0; Potassium 4.2; Sodium 137; TSH 0.93    Lipid Panel Lab Results  Component Value Date   CHOL 232 (H) 04/11/2020   HDL 82.80 04/11/2020   LDLCALC 128 (H) 04/11/2020   TRIG 106.0 04/11/2020      Wt Readings from Last 3 Encounters:  03/06/21 121 lb (54.9 kg)  02/24/20 111 lb (50.3 kg)  01/11/20 114 lb  (51.7 kg)     ASSESSMENT AND PLAN:  Problem List Items Addressed This Visit   None Visit Diagnoses     Family history of high cholesterol    -  Primary   Relevant Orders   CT CARDIAC SCORING (SELF PAY ONLY)   Hepatic function panel   Screening for hyperlipidemia       Relevant Orders   CT CARDIAC SCORING (SELF PAY ONLY)   Hepatic function panel   Normal cardiac exam       Relevant Orders   EKG 12-Lead      Hyperlipidemia Screening with repeat coronary calcium score ordered Discussed various treatment options including statins and other nonstatin options including Zetia  Prefers no medications at this time  Elevated ALT Order placed for repeat LFTs Denies alcohol Reports around that time she had other issues going on that may have contributed Has not had follow-up with primary care    Total encounter time more than 45 minutes  Greater than 50% was spent in counseling and coordination of care with the patient    Signed, Esmond Plants, M.D., Ph.D. [place], [place]

## 2021-03-06 NOTE — Addendum Note (Signed)
Addended by: Minna Merritts on: 03/06/2021 05:56 PM   Modules accepted: Level of Service

## 2021-03-06 NOTE — Patient Instructions (Addendum)
Medication Instructions:  No changes  Lab work: LFTs (please call to schedule appt for labs  Testing/Procedures: We have order a CT coronary calcium score (you may see results on your MyChart account, but we will call via phone with the results) This is a $99 out of pocket expense   This procedure uses special x-ray equipment to produce pictures of the coronary arteries to determine if they are blocked or narrowed by the buildup of plaque - an indicator for atherosclerosis or coronary artery disease (CAD).  Please call [phone] to schedule at your earliest convince   This is done at our [facility] in [facility] [place], [place] [address]    Follow-Up: At [facility], you and your health needs are our priority.  As part of our continuing mission to provide you with exceptional heart care, we have created designated Provider Care Teams.  These Care Teams include your primary Cardiologist (physician) and Advanced Practice Providers (APPs -  Physician Assistants and Nurse Practitioners) who all work together to provide you with the care you need, when you need it.  You will need a follow up appointment as needed  Providers on your designated Care Team:   Murray Hodgkins, NP Christell Faith, PA-C Marrianne Mood, PA-C [place], [place]  COVID-19 Vaccine Information can be found at: [email or web address].uk For questions related to vaccine distribution or appointments, please email [email or web address] [email or web address] or call 3057443252.

## 2021-03-07 ENCOUNTER — Encounter: Payer: Self-pay | Admitting: Gastroenterology

## 2021-03-20 DIAGNOSIS — M25561 Pain in right knee: Secondary | ICD-10-CM | POA: Diagnosis not present

## 2021-03-27 ENCOUNTER — Other Ambulatory Visit: Payer: Self-pay

## 2021-03-27 ENCOUNTER — Ambulatory Visit
Admission: RE | Admit: 2021-03-27 | Discharge: 2021-03-27 | Disposition: A | Discharge status: Home / Self Care | Payer: 59 | Source: Ambulatory Visit | Attending: Cardiovascular Disease | Admitting: Cardiovascular Disease

## 2021-03-27 DIAGNOSIS — Z13220 Encounter for screening for lipoid disorders: Secondary | ICD-10-CM | POA: Insufficient documentation

## 2021-03-27 DIAGNOSIS — Z8342 Family history of familial hypercholesterolemia: Secondary | ICD-10-CM | POA: Insufficient documentation

## 2021-04-02 ENCOUNTER — Telehealth: Payer: Self-pay

## 2021-04-02 ENCOUNTER — Ambulatory Visit: Payer: 59 | Admitting: Cardiovascular Disease

## 2021-04-02 NOTE — Telephone Encounter (Signed)
Attempted to reach pt by phone, was not able to make contact No DPR on file Will attempt to call back at later time CT coronary calcium score  Score of 0  Excellent finding, indicating no calcified coronary atherosclerotic plaque noted  No other significant findings on the scan were picked up

## 2021-04-06 ENCOUNTER — Telehealth: Payer: Self-pay

## 2021-04-06 ENCOUNTER — Other Ambulatory Visit: Payer: Self-pay | Admitting: Internal Medicine

## 2021-04-06 DIAGNOSIS — I8311 Varicose veins of right lower extremity with inflammation: Secondary | ICD-10-CM | POA: Diagnosis not present

## 2021-04-06 DIAGNOSIS — E785 Hyperlipidemia, unspecified: Secondary | ICD-10-CM

## 2021-04-06 NOTE — Telephone Encounter (Signed)
Attempt #2 to reach out to pt Unable to make contact via phone LMTCB

## 2021-04-06 NOTE — Telephone Encounter (Signed)
Pt returning call regarding her recent CT Calcium Score Dr. Rockey Situ had a chance to review her results and advised   "CT coronary calcium score  Score of 0  Excellent finding, indicating no calcified coronary atherosclerotic plaque noted  No other significant findings on the scan were picked up "  Joy Reid very thankful for the phone call of her results, her PCP has arranged form her to have a series of labs on 11/10, she will have PCP to add on lipid panel. We have order a LFT to be drawn, will cancel order as her PCP has this order for her already. Advised Dr. Rockey Situ will be able to see results in pt's EMR. Otherwise all questions and concerns were address with nothing further at this time. Will see at next schedule f/u appt.

## 2021-04-09 DIAGNOSIS — Z09 Encounter for follow-up examination after completed treatment for conditions other than malignant neoplasm: Secondary | ICD-10-CM | POA: Diagnosis not present

## 2021-04-09 DIAGNOSIS — I83811 Varicose veins of right lower extremities with pain: Secondary | ICD-10-CM | POA: Diagnosis not present

## 2021-04-13 DIAGNOSIS — Z09 Encounter for follow-up examination after completed treatment for conditions other than malignant neoplasm: Secondary | ICD-10-CM | POA: Diagnosis not present

## 2021-04-13 DIAGNOSIS — I83811 Varicose veins of right lower extremities with pain: Secondary | ICD-10-CM | POA: Diagnosis not present

## 2021-04-13 DIAGNOSIS — I872 Venous insufficiency (chronic) (peripheral): Secondary | ICD-10-CM | POA: Diagnosis not present

## 2021-04-13 DIAGNOSIS — I83891 Varicose veins of right lower extremities with other complications: Secondary | ICD-10-CM | POA: Diagnosis not present

## 2021-04-17 DIAGNOSIS — Z20822 Contact with and (suspected) exposure to covid-19: Secondary | ICD-10-CM | POA: Diagnosis not present

## 2021-04-17 DIAGNOSIS — Z03818 Encounter for observation for suspected exposure to other biological agents ruled out: Secondary | ICD-10-CM | POA: Diagnosis not present

## 2021-04-19 DIAGNOSIS — Z03818 Encounter for observation for suspected exposure to other biological agents ruled out: Secondary | ICD-10-CM | POA: Diagnosis not present

## 2021-04-19 DIAGNOSIS — Z20822 Contact with and (suspected) exposure to covid-19: Secondary | ICD-10-CM | POA: Diagnosis not present

## 2021-04-20 DIAGNOSIS — Z03818 Encounter for observation for suspected exposure to other biological agents ruled out: Secondary | ICD-10-CM | POA: Diagnosis not present

## 2021-04-20 DIAGNOSIS — Z20822 Contact with and (suspected) exposure to covid-19: Secondary | ICD-10-CM | POA: Diagnosis not present

## 2021-04-22 ENCOUNTER — Other Ambulatory Visit: Payer: Self-pay | Admitting: Internal Medicine

## 2021-04-22 DIAGNOSIS — H109 Unspecified conjunctivitis: Secondary | ICD-10-CM | POA: Insufficient documentation

## 2021-04-22 DIAGNOSIS — H10023 Other mucopurulent conjunctivitis, bilateral: Secondary | ICD-10-CM

## 2021-04-22 MED ORDER — ERYTHROMYCIN 5 MG/GM OP OINT: 1.0000 "application " | TOPICAL_OINTMENT | Freq: Three times a day (TID) | OPHTHALMIC | 0 refills | Status: AC

## 2021-04-23 ENCOUNTER — Other Ambulatory Visit (INDEPENDENT_AMBULATORY_CARE_PROVIDER_SITE_OTHER): Payer: 59

## 2021-04-23 ENCOUNTER — Other Ambulatory Visit: Payer: Self-pay

## 2021-04-23 ENCOUNTER — Encounter: Payer: Self-pay | Admitting: Gastroenterology

## 2021-04-23 DIAGNOSIS — E785 Hyperlipidemia, unspecified: Secondary | ICD-10-CM

## 2021-04-23 DIAGNOSIS — Z8342 Family history of familial hypercholesterolemia: Secondary | ICD-10-CM | POA: Diagnosis not present

## 2021-04-23 DIAGNOSIS — Z13220 Encounter for screening for lipoid disorders: Secondary | ICD-10-CM | POA: Diagnosis not present

## 2021-04-23 LAB — BASIC METABOLIC PANEL
BUN: 13 mg/dL (ref 6–23)
CO2: 31 mEq/L (ref 19–32)
Calcium: 9.1 mg/dL (ref 8.4–10.5)
Chloride: 104 mEq/L (ref 96–112)
Creatinine, Ser: 0.59 mg/dL (ref 0.40–1.20)
GFR: 96.41 mL/min (ref >60.00)
Glucose, Bld: 87 mg/dL (ref 70–99)
Potassium: 4.5 mEq/L (ref 3.5–5.1)
Sodium: 140 mEq/L (ref 135–145)

## 2021-04-23 LAB — LIPID PANEL
Cholesterol: 241 mg/dL — ABNORMAL HIGH (ref 0–200)
HDL: 86 mg/dL (ref >39.00)
LDL Cholesterol: 136 mg/dL — ABNORMAL HIGH (ref 0–99)
NonHDL: 155.07
Total CHOL/HDL Ratio: 3
Triglycerides: 97 mg/dL (ref 0.0–149.0)
VLDL: 19.4 mg/dL (ref 0.0–40.0)

## 2021-04-24 DIAGNOSIS — I83891 Varicose veins of right lower extremities with other complications: Secondary | ICD-10-CM | POA: Diagnosis not present

## 2021-04-24 DIAGNOSIS — I87321 Chronic venous hypertension (idiopathic) with inflammation of right lower extremity: Secondary | ICD-10-CM | POA: Diagnosis not present

## 2021-04-24 DIAGNOSIS — I87391 Chronic venous hypertension (idiopathic) with other complications of right lower extremity: Secondary | ICD-10-CM | POA: Diagnosis not present

## 2021-04-24 DIAGNOSIS — Z09 Encounter for follow-up examination after completed treatment for conditions other than malignant neoplasm: Secondary | ICD-10-CM | POA: Diagnosis not present

## 2021-04-24 LAB — HEPATIC FUNCTION PANEL
ALT: 15 IU/L (ref 0–32)
AST: 23 IU/L (ref 0–40)
Albumin: 4.6 g/dL (ref 3.8–4.8)
Alkaline Phosphatase: 52 IU/L (ref 44–121)
Bilirubin Total: 0.3 mg/dL (ref 0.0–1.2)
Bilirubin, Direct: <0.1 mg/dL (ref 0.00–0.40)
Total Protein: 6.5 g/dL (ref 6.0–8.5)

## 2021-05-07 DIAGNOSIS — I83811 Varicose veins of right lower extremities with pain: Secondary | ICD-10-CM | POA: Diagnosis not present

## 2021-05-10 ENCOUNTER — Other Ambulatory Visit: Payer: Self-pay | Admitting: Internal Medicine

## 2021-05-10 MED ORDER — FAMCICLOVIR 500 MG PO TABS: 1000.0000 mg | ORAL_TABLET | Freq: Two times a day (BID) | ORAL | 0 refills | Status: DC

## 2021-05-11 ENCOUNTER — Other Ambulatory Visit: Payer: Self-pay | Admitting: Internal Medicine

## 2021-05-11 MED ORDER — PREDNISONE 10 MG PO TABS: ORAL_TABLET | ORAL | 0 refills | Status: DC

## 2021-05-22 DIAGNOSIS — Z03818 Encounter for observation for suspected exposure to other biological agents ruled out: Secondary | ICD-10-CM | POA: Diagnosis not present

## 2021-05-22 DIAGNOSIS — Z20822 Contact with and (suspected) exposure to covid-19: Secondary | ICD-10-CM | POA: Diagnosis not present

## 2021-05-23 ENCOUNTER — Other Ambulatory Visit: Payer: Self-pay | Admitting: Internal Medicine

## 2021-05-23 DIAGNOSIS — U071 COVID-19: Secondary | ICD-10-CM

## 2021-05-23 MED ORDER — MOLNUPIRAVIR EUA 200MG CAPSULE: 4.0000 | ORAL_CAPSULE | Freq: Two times a day (BID) | ORAL | 0 refills | Status: AC

## 2021-05-23 NOTE — Assessment & Plan Note (Signed)
Second infection.  Treating with molnupiravir

## 2021-05-29 DIAGNOSIS — Z20822 Contact with and (suspected) exposure to covid-19: Secondary | ICD-10-CM | POA: Diagnosis not present

## 2021-05-29 DIAGNOSIS — Z03818 Encounter for observation for suspected exposure to other biological agents ruled out: Secondary | ICD-10-CM | POA: Diagnosis not present

## 2021-06-18 ENCOUNTER — Ambulatory Visit (AMBULATORY_SURGERY_CENTER): Payer: Self-pay

## 2021-06-18 ENCOUNTER — Other Ambulatory Visit: Payer: Self-pay

## 2021-06-18 VITALS — Ht 65.0 in | Wt 118.0 lb

## 2021-06-18 DIAGNOSIS — Z1211 Encounter for screening for malignant neoplasm of colon: Secondary | ICD-10-CM

## 2021-06-18 MED ORDER — NA SULFATE-K SULFATE-MG SULF 17.5-3.13-1.6 GM/177ML PO SOLN: 1.0000 | Freq: Once | ORAL | 0 refills | Status: DC

## 2021-06-18 MED ORDER — NA SULFATE-K SULFATE-MG SULF 17.5-3.13-1.6 GM/177ML PO SOLN: 1.0000 | Freq: Once | ORAL | 0 refills | Status: AC

## 2021-06-18 NOTE — Progress Notes (Signed)
Denies allergies to eggs or soy products. Denies complication of anesthesia or sedation. Denies use of weight loss medication. Denies use of O2.   Emmi instructions given for colonoscopy.  

## 2021-06-18 NOTE — Addendum Note (Signed)
Addended by: Otho Perl on: 06/18/2021 09:27 AM   Modules accepted: Orders

## 2021-06-26 ENCOUNTER — Encounter: Payer: Self-pay | Admitting: Gastroenterology

## 2021-07-02 ENCOUNTER — Other Ambulatory Visit: Payer: Self-pay

## 2021-07-02 ENCOUNTER — Ambulatory Visit (AMBULATORY_SURGERY_CENTER): Payer: 59 | Admitting: Gastroenterology

## 2021-07-02 ENCOUNTER — Encounter: Payer: Self-pay | Admitting: Gastroenterology

## 2021-07-02 VITALS — BP 118/51 | HR 69 | Temp 97.5°F | Resp 11 | Ht 65.0 in | Wt 118.0 lb

## 2021-07-02 DIAGNOSIS — K635 Polyp of colon: Secondary | ICD-10-CM

## 2021-07-02 DIAGNOSIS — Z1211 Encounter for screening for malignant neoplasm of colon: Secondary | ICD-10-CM

## 2021-07-02 DIAGNOSIS — D123 Benign neoplasm of transverse colon: Secondary | ICD-10-CM

## 2021-07-02 DIAGNOSIS — D122 Benign neoplasm of ascending colon: Secondary | ICD-10-CM | POA: Diagnosis not present

## 2021-07-02 MED ORDER — SODIUM CHLORIDE 0.9 % IV SOLN: 500.0000 mL | Freq: Once | INTRAVENOUS | Status: DC

## 2021-07-02 NOTE — Progress Notes (Signed)
Pt's states no medical or surgical changes since previsit or office visit. 

## 2021-07-02 NOTE — Progress Notes (Signed)
Called to room to assist during endoscopic procedure.  Patient ID and intended procedure confirmed with present staff. Received instructions for my participation in the procedure from the performing physician.  

## 2021-07-02 NOTE — Patient Instructions (Signed)
High fiber diet, continue current medications Awaiting pathology results Repeat Colonoscopy date to be determined based on pathology results.  YOU HAD AN ENDOSCOPIC PROCEDURE TODAY AT [facility]:   Refer to the procedure report that was given to you for any specific questions about what was found during the examination.  If the procedure report does not answer your questions, please call your gastroenterologist to clarify.  If you requested that your care partner not be given the details of your procedure findings, then the procedure report has been included in a sealed envelope for you to review at your convenience later.  YOU SHOULD EXPECT: Some feelings of bloating in the abdomen. Passage of more gas than usual.  Walking can help get rid of the air that was put into your GI tract during the procedure and reduce the bloating. If you had a lower endoscopy (such as a colonoscopy or flexible sigmoidoscopy) you may notice spotting of blood in your stool or on the toilet paper. If you underwent a bowel prep for your procedure, you may not have a normal bowel movement for a few days.  Please Note:  You might notice some irritation and congestion in your nose or some drainage.  This is from the oxygen used during your procedure.  There is no need for concern and it should clear up in a day or so.  SYMPTOMS TO REPORT IMMEDIATELY:  Following lower endoscopy (colonoscopy or flexible sigmoidoscopy):  Excessive amounts of blood in the stool  Significant tenderness or worsening of abdominal pains  Swelling of the abdomen that is new, acute  Fever of 100F or higher  For urgent or emergent issues, a gastroenterologist can be reached at any hour by calling [phone]. Do not use MyChart messaging for urgent concerns.    DIET:  We do recommend a small meal at first, but then you may proceed to your regular diet.  Drink plenty of fluids but you should avoid alcoholic beverages for 24  hours.  ACTIVITY:  You should plan to take it easy for the rest of today and you should NOT DRIVE or use heavy machinery until tomorrow (because of the sedation medicines used during the test).    FOLLOW UP: Our staff will call the number listed on your records 48-72 hours following your procedure to check on you and address any questions or concerns that you may have regarding the information given to you following your procedure. If we do not reach you, we will leave a message.  We will attempt to reach you two times.  During this call, we will ask if you have developed any symptoms of COVID 19. If you develop any symptoms (ie: fever, flu-like symptoms, shortness of breath, cough etc.) before then, please call [phone].  If you test positive for Covid 19 in the 2 weeks post procedure, please call and report this information to [place].    If any biopsies were taken you will be contacted by phone or by letter within the next 1-3 weeks.  Please call us at [phone] if you have not heard about the biopsies in 3 weeks.    SIGNATURES/CONFIDENTIALITY: You and/or your care partner have signed paperwork which will be entered into your electronic medical record.  These signatures attest to the fact that that the information above on your After Visit Summary has been reviewed and is understood.  Full responsibility of the confidentiality of this discharge information lies with you and/or your care-partner.

## 2021-07-02 NOTE — Progress Notes (Signed)
A and O x3. Report to RN. Tolerated MAC anesthesia well. 

## 2021-07-02 NOTE — Progress Notes (Signed)
Referring Provider: Crecencio Mc, MD Primary Care Physician:  Crecencio Mc, MD  Reason for Procedure:  Colon cancer screening   IMPRESSION:  Need for colon cancer screening Appropriate candidate for monitored anesthesia care  PLAN: Colonoscopy in the [place] today   HPI: Joy Reid is a 61 y.o. female presents for screening colonoscopy.  Normal screening colonoscopy with Dr. Olevia Perches in 2012.  No baseline GI symptoms.   No known family history of colon cancer or polyps. No family history of uterine/endometrial cancer, pancreatic cancer or gastric/stomach cancer.   Past Medical History:  Diagnosis Date   GERD (gastroesophageal reflux disease)    Spontaneous pneumothorax    as a teenager, bilateral (sequential) s/p surgical stapling   Spontaneous pneumothorax    as a teenager, bilateral (sequential) s/p surgical stapling    Past Surgical History:  Procedure Laterality Date   AUGMENTATION MAMMAPLASTY Bilateral [facility]   x 3   colpoplexy  05/2019   LAPAROSCOPIC SUPRACERVICAL HYSTERECTOMY  05/2019   LUNG SURGERY  1989   2nd lung stapling,  left side for recurrent PTX   salpingectomy Bilateral 05/2019    Current Outpatient Medications  Medication Sig Dispense Refill   estradiol (ESTRACE) 2 MG tablet Take 1 tablet (2 mg total) by mouth daily. 30 tablet 5   gabapentin (NEURONTIN) 100 MG capsule Take 2 capsules (200 mg total) by mouth at bedtime. 180 capsule 3   OVER THE COUNTER MEDICATION Vitamin B complex, one tablet daily.     OVER THE COUNTER MEDICATION Magnesium one tablet daily.     OVER THE COUNTER MEDICATION Vitamin K 2 D3, one capsule daily.     OVER THE COUNTER MEDICATION Turmeric and ginger, one capsule daily.     OVER THE COUNTER MEDICATION Fish Oil one capsule daily.     ergocalciferol (DRISDOL) 1.25 MG (50000 UT) capsule Take 1 capsule (50,000 Units total) by mouth once a week. (Patient not taking: Reported on 06/18/2021) 12 capsule  0   famciclovir (FAMVIR) 500 MG tablet Take 2 tablets (1,000 mg total) by mouth 2 (two) times daily. 14 tablet 0   predniSONE (DELTASONE) 10 MG tablet 6 tablets daily for 3 days, then reduce by 1 tablet daily until gone 33 tablet 0   Current Facility-Administered Medications  Medication Dose Route Frequency Provider Last Rate Last Admin   0.9 %  sodium chloride infusion  500 mL Intravenous Once Thornton Park, MD        Allergies as of 07/02/2021   (No Known Allergies)    Family History  Problem Relation Age of Onset   Colon cancer Maternal Grandfather    Cancer Neg Hx    Hearing loss Neg Hx    Heart disease Neg Hx    Diabetes Neg Hx    Early death Neg Hx    Breast cancer Neg Hx    Esophageal cancer Neg Hx    Rectal cancer Neg Hx    Stomach cancer Neg Hx      Physical Exam: General:   Alert,  well-nourished, pleasant and cooperative in NAD Head:  Normocephalic and atraumatic. Eyes:  Sclera clear, no icterus.   Conjunctiva pink. Mouth:  No deformity or lesions.   Neck:  Supple; no masses or thyromegaly. Lungs:  Clear throughout to auscultation.   No wheezes. Heart:  Regular rate and rhythm; no murmurs. Abdomen:  Soft, non-tender, nondistended, normal bowel sounds, no rebound or guarding.  Msk:  Symmetrical. No boney deformities LAD: No inguinal or umbilical LAD Extremities:  No clubbing or edema. Neurologic:  Alert and  oriented x4;  grossly nonfocal Skin:  No obvious rash or bruise. Psych:  Alert and cooperative. Normal mood and affect.     Studies/Results: No results found.    Euphemia Lingerfelt L. Tarri Glenn, MD, MPH 07/02/2021, 8:35 AM

## 2021-07-02 NOTE — Op Note (Signed)
[place] Patient Name: Joy Reid Procedure Date: 07/02/2021 8:35 AM MRN: [id number] Endoscopist: Thornton Park MD, MD Age: 61 Referring MD:  Date of Birth: 12-20-1960 Gender: Female Account #: [id number] Procedure:                Colonoscopy Indications:              Screening for colorectal malignant neoplasm                           Normal colonoscopy with Dr. Olevia Perches 2012                           No known family history of colon cancer or polyps Medicines:                Monitored Anesthesia Care Procedure:                Pre-Anesthesia Assessment:                           - Prior to the procedure, a History and Physical                            was performed, and patient medications and                            allergies were reviewed. The patient's tolerance of                            previous anesthesia was also reviewed. The risks                            and benefits of the procedure and the sedation                            options and risks were discussed with the patient.                            All questions were answered, and informed consent                            was obtained. Prior Anticoagulants: The patient has                            taken no previous anticoagulant or antiplatelet                            agents. ASA Grade Assessment: II - A patient with                            mild systemic disease. After reviewing the risks                            and benefits, the patient was deemed in  satisfactory condition to undergo the procedure.                           After obtaining informed consent, the colonoscope                            was passed under direct vision. Throughout the                            procedure, the patient's blood pressure, pulse, and                            oxygen saturations were monitored continuously. The                            Olympus PCF-H190DL  (#[id number]) Colonoscope was                            introduced through the anus and advanced to the 3                            cm into the ileum. A second forward view of the                            right colon was performed. The colonoscopy was                            performed with moderate difficulty due to poor                            bowel prep and significant looping. Successful                            completion of the procedure was aided by applying                            abdominal pressure. The patient tolerated the                            procedure well. The quality of the bowel                            preparation was adequate. The exam was prolonged                            given the need to clean the colon and treat                            colonoscope clogging due to residue food fibers. Scope In: 8:47:14 AM Scope Out: 9:11:33 AM Scope Withdrawal Time: 0 hours 17 minutes 40 seconds  Total Procedure Duration: 0 hours 24 minutes 19 seconds  Findings:                 The perianal and  digital rectal examinations were                            normal.                           A 2 mm polyp was found in the transverse colon. The                            polyp was sessile. The polyp was removed with a                            cold snare. Resection and retrieval were complete.                            Estimated blood loss was minimal.                           A 2 mm polyp was found in the ascending colon. The                            polyp was flat. The polyp was removed with a cold                            snare. Resection and retrieval were complete.                            Estimated blood loss was minimal.                           The colon is redundant. The exam was otherwise                            without abnormality on direct and retroflexion                            views. Complications:            No immediate  complications. Estimated blood loss:                            Minimal. Estimated Blood Loss:     Estimated blood loss was minimal. Impression:               - One 2 mm polyp in the transverse colon, removed                            with a cold snare. Resected and retrieved.                           - One 2 mm polyp in the ascending colon, removed                            with a cold snare. Resected and retrieved.                           -  The examination was otherwise normal on direct                            and retroflexion views. Recommendation:           - Patient has a contact number available for                            emergencies. The signs and symptoms of potential                            delayed complications were discussed with the                            patient. Return to normal activities tomorrow.                            Written discharge instructions were provided to the                            patient.                           - High fiber diet.                           - Continue present medications.                           - Await pathology results.                           - Repeat colonoscopy date to be determined after                            pending pathology results are reviewed for                            surveillance. Use a different bowel prep in the                            future.                           - Emerging evidence supports eating a diet of                            fruits, vegetables, grains, calcium, and yogurt                            while reducing red meat and alcohol may reduce the                            risk of colon cancer.                           - Thank  you for allowing me to be involved in your                            colon cancer prevention. Thornton Park MD, MD 07/02/2021 9:19:12 AM This report has been signed electronically.

## 2021-07-04 ENCOUNTER — Telehealth: Payer: Self-pay | Admitting: *Deleted

## 2021-07-04 NOTE — Telephone Encounter (Signed)
°  Follow up Call-  Call back number 07/02/2021  Post procedure Call Back phone  # [phone]  Permission to leave phone message Yes  Some recent data might be hidden     Patient questions:  Do you have a fever, pain , or abdominal swelling? No. Pain Score  0 *  Have you tolerated food without any problems? Yes.    Have you been able to return to your normal activities? Yes.    Do you have any questions about your discharge instructions: Diet   No. Medications  No. Follow up visit  No.  Do you have questions or concerns about your Care? No.  Actions: * If pain score is 4 or above: No action needed, pain <4.

## 2021-08-08 ENCOUNTER — Encounter: Payer: Self-pay | Admitting: Internal Medicine

## 2021-08-29 ENCOUNTER — Encounter: Payer: Self-pay | Admitting: Emergency Medicine

## 2021-08-29 ENCOUNTER — Ambulatory Visit
Admission: EM | Admit: 2021-08-29 | Discharge: 2021-08-29 | Disposition: A | Discharge status: Home / Self Care | Payer: 59 | Attending: Emergency Medicine | Admitting: Emergency Medicine

## 2021-08-29 DIAGNOSIS — B354 Tinea corporis: Secondary | ICD-10-CM | POA: Diagnosis not present

## 2021-08-29 MED ORDER — CLOTRIMAZOLE 1 % EX CREA
TOPICAL_CREAM | CUTANEOUS | 0 refills | Status: DC
Start: 2021-08-29 — End: 2022-08-01

## 2021-08-29 NOTE — ED Triage Notes (Signed)
Pt presents with a rash on her face x 1 week  ?

## 2021-08-29 NOTE — ED Provider Notes (Signed)
?[facility] ? ? ? ?CSN: [id number] ?Arrival date & time: 08/29/21  1157 ? ? ?  ? ?History   ?Chief Complaint ?Chief Complaint  ?Patient presents with  ? Rash  ? ? ?HPI ?Joy Reid is a 61 y.o. female.  Patient presents with a rash on her left upper lip x1 week.  She denies other rash, fever, chills, sore throat, cough, shortness of breath, vomiting, diarrhea, or other symptoms.  No treatments at home.  She mentions that they recently got a puppy but she is only held it once. ? ?The history is provided by the patient and medical records.  ? ?Past Medical History:  ?Diagnosis Date  ? GERD (gastroesophageal reflux disease)   ? Spontaneous pneumothorax   ? as a teenager, bilateral (sequential) s/p surgical stapling  ? Spontaneous pneumothorax   ? as a teenager, bilateral (sequential) s/p surgical stapling  ? ? ?Patient Active Problem List  ? Diagnosis Date Noted  ? Conjunctivitis 04/22/2021  ? COVID-19 06/09/2020  ? Acute cystitis 01/18/2020  ? Greater trochanteric bursitis of both hips 01/11/2020  ? Chronic hip pain, bilateral 12/10/2019  ? Vaginitis 09/27/2019  ? Tremor 06/01/2019  ? Dry mouth, unspecified 06/01/2019  ? Urinary incontinence 06/01/2019  ? Stress incontinence in female 05/06/2019  ? Uterine prolapse 05/06/2019  ? Closed rib fracture 08/10/2018  ? Cervical neck pain with evidence of disc disease 02/28/2015  ? Posterior neck pain 07/29/2014  ? Screening for breast cancer 12/20/2012  ? Encounter for preventive health examination 12/18/2012  ? Dysphonia 12/08/2012  ? Vitamin D deficiency 07/07/2012  ? Calcaneal bursitis 11/26/2011  ? 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 Pain in foot 11/26/2011  ? Plantar fasciitis 11/26/2011  ? GERD (gastroesophageal reflux disease) 06/23/2011  ? Hip pain, left 06/23/2011  ? Screening for colon cancer 06/23/2011  ? Menopause 06/23/2011  ? ? ?Past Surgical History:  ?Procedure Laterality Date  ? AUGMENTATION MAMMAPLASTY Bilateral 1988  ? [place]  ? x 3  ?  colpoplexy  05/2019  ? LAPAROSCOPIC SUPRACERVICAL HYSTERECTOMY  05/2019  ? LUNG SURGERY  1989  ? 2nd lung stapling,  left side for recurrent PTX  ? salpingectomy Bilateral 05/2019  ? ? ?OB History   ?No obstetric history on file. ?  ? ? ? ?Home Medications   ? ?Prior to Admission medications   ?Medication Sig Start Date End Date Taking? Authorizing Provider  ?clotrimazole (LOTRIMIN) 1 % cream Apply to affected area 2 times daily 08/29/21  Yes Sharion Balloon, NP  ?ergocalciferol (DRISDOL) 1.25 MG (50000 UT) capsule Take 1 capsule (50,000 Units total) by mouth once a week. ?Patient not taking: Reported on 06/18/2021 04/12/20   Crecencio Mc, MD  ?estradiol (ESTRACE) 2 MG tablet Take 1 tablet (2 mg total) by mouth daily. 11/09/19   Crecencio Mc, MD  ?gabapentin (NEURONTIN) 100 MG capsule Take 2 capsules (200 mg total) by mouth at bedtime. 01/11/20   Lyndal Pulley, DO  ?OVER THE COUNTER MEDICATION Vitamin B complex, one tablet daily.    [provider]  ?OVER THE COUNTER MEDICATION Magnesium one tablet daily.    [provider]  ?OVER THE COUNTER MEDICATION Vitamin K 2 D3, one capsule daily.    [provider]  ?OVER THE COUNTER MEDICATION Turmeric and ginger, one capsule daily.    [provider]  ?OVER THE COUNTER MEDICATION Fish Oil one capsule daily.    [provider]  ? ? ?  Family History ?Family History  ?Problem Relation Age of Onset  ? Colon cancer Maternal Grandfather   ? Cancer Neg Hx   ? Hearing loss Neg Hx   ? Heart disease Neg Hx   ? Diabetes Neg Hx   ? Early death Neg Hx   ? Breast cancer Neg Hx   ? Esophageal cancer Neg Hx   ? Rectal cancer Neg Hx   ? Stomach cancer Neg Hx   ? ? ?Social History ?Social History  ? ?Tobacco Use  ? Smoking status: Former  ?  Types: Cigarettes  ?  Quit date: 12/27/1983  ?  Years since quitting: 37.6  ? Smokeless tobacco: Never  ?Vaping Use  ? Vaping Use: Never used  ?Substance Use Topics  ? Alcohol use: Yes  ?  Alcohol/week: 3.0  standard drinks  ?  Types: 3 Cans of beer per week  ? Drug use: No  ? ? ? ?Allergies   ?Patient has no known allergies. ? ? ?Review of Systems ?Review of Systems  ?Constitutional:  Negative for chills and fever.  ?HENT:  Negative for ear pain and sore throat.   ?Respiratory:  Negative for cough and shortness of breath.   ?Cardiovascular:  Negative for chest pain and palpitations.  ?Gastrointestinal:  Negative for diarrhea and vomiting.  ?Skin:  Positive for rash. Negative for color change.  ?All other systems reviewed and are negative. ? ? ?Physical Exam ?Triage Vital Signs ?ED Triage Vitals  ?Enc Vitals Group  ?   BP   ?   Pulse   ?   Resp   ?   Temp   ?   Temp src   ?   SpO2   ?   Weight   ?   Height   ?   Head Circumference   ?   Peak Flow   ?   Pain Score   ?   Pain Loc   ?   Pain Edu?   ?   Excl. in Rule?   ? ?No data found. ? ?Updated Vital Signs ?BP 119/79   Pulse 64   Temp 97.9 ?F (36.6 ?C)   Resp 18   SpO2 98%  ? ?Visual Acuity ?Right Eye Distance:   ?Left Eye Distance:   ?Bilateral Distance:   ? ?Right Eye Near:   ?Left Eye Near:    ?Bilateral Near:    ? ?Physical Exam ?Vitals and nursing note reviewed.  ?Constitutional:   ?   General: She is not in acute distress. ?   Appearance: She is well-developed. She is not ill-appearing.  ?HENT:  ?   Mouth/Throat:  ?   Mouth: Mucous membranes are moist.  ?   Pharynx: Oropharynx is clear.  ?Cardiovascular:  ?   Rate and Rhythm: Normal rate and regular rhythm.  ?   Heart sounds: Normal heart sounds.  ?Pulmonary:  ?   Effort: Pulmonary effort is normal. No respiratory distress.  ?   Breath sounds: Normal breath sounds.  ?Musculoskeletal:  ?   Cervical back: Neck supple.  ?Skin: ?   General: Skin is warm and dry.  ?   Findings: Rash present.  ?   Comments: Skin above left upper lip: Light pink circular rash with papules on border.  No open lesion or drainage. Rash does not touch lip.   ?Neurological:  ?   Mental Status: She is alert.  ?Psychiatric:     ?   Mood and  Affect: Mood  normal.     ?   Behavior: Behavior normal.  ? ? ? ? ?UC Treatments / Results  ?Labs ?(all labs ordered are listed, but only abnormal results are displayed) ?Labs Reviewed - No data to display ? ?EKG ? ? ?Radiology ?No results found. ? ?Procedures ?Procedures (including critical care time) ? ?Medications Ordered in UC ?Medications - No data to display ? ?Initial Impression / Assessment and Plan / UC Course  ?I have reviewed the triage vital signs and the nursing notes. ? ?Pertinent labs & imaging results that were available during my care of the patient were reviewed by me and considered in my medical decision making (see chart for details). ? ?  ?Tinea corporis.  Treating with clotrimazole cream.  Education provided on tinea corporis.  Instructed patient to follow-up with a dermatologist if her symptoms are not improving.  She agrees to plan of care. ? ?Final Clinical Impressions(s) / UC Diagnoses  ? ?Final diagnoses:  ?Tinea corporis  ? ? ? ?Discharge Instructions   ? ?  ?Use the antifungal cream as directed.  Follow up with a dermatologist if not improving.  ? ? ? ? ? ?ED Prescriptions   ? ? Medication Sig Dispense Auth. Provider  ? clotrimazole (LOTRIMIN) 1 % cream Apply to affected area 2 times daily 15 g Sharion Balloon, NP  ? ?  ? ?PDMP not reviewed this encounter. ?  ?Sharion Balloon, NP ?08/29/21 1253 ? ?

## 2021-08-29 NOTE — Discharge Instructions (Addendum)
Use the antifungal cream as directed.  Follow up with a dermatologist if not improving.  ? ?

## 2021-09-11 DIAGNOSIS — X32XXXS Exposure to sunlight, sequela: Secondary | ICD-10-CM | POA: Diagnosis not present

## 2021-09-11 DIAGNOSIS — D2261 Melanocytic nevi of right upper limb, including shoulder: Secondary | ICD-10-CM | POA: Diagnosis not present

## 2021-09-11 DIAGNOSIS — L818 Other specified disorders of pigmentation: Secondary | ICD-10-CM | POA: Diagnosis not present

## 2021-09-11 DIAGNOSIS — L814 Other melanin hyperpigmentation: Secondary | ICD-10-CM | POA: Diagnosis not present

## 2021-10-22 DIAGNOSIS — B358 Other dermatophytoses: Secondary | ICD-10-CM | POA: Diagnosis not present

## 2021-11-30 DIAGNOSIS — L308 Other specified dermatitis: Secondary | ICD-10-CM | POA: Diagnosis not present

## 2021-11-30 DIAGNOSIS — L309 Dermatitis, unspecified: Secondary | ICD-10-CM | POA: Diagnosis not present

## 2021-12-29 IMAGING — CT CT CARDIAC CORONARY ARTERY CALCIUM SCORE
3 series · 14 of 20 positions shown, 16 images · non-contrast
Comparison: None.
COMPARISON: None.

Addendum:
EXAM:
OVER-READ INTERPRETATION  CT CHEST

The following report is an over-read performed by radiologist Dr.
Seepersand Susty [REDACTED] on 03/27/2021. This
over-read does not include interpretation of cardiac or coronary
anatomy or pathology. The coronary calcium score/coronary CTA
interpretation by the cardiologist is attached.
CLINICAL DATA: Risk stratification
Coronary Calcium Score
TECHNIQUE: The patient was scanned on a Siemens Somatom go.Top Scanner. Axial
non-contrast 3 mm slices were carried out through the heart. The
data set was analyzed on a dedicated work station and scored using
the Agatson method.

[Series 2: sa36 calcium scoring 3.00 · axial · 0.30mm/px · z∈[-1088,-1007]mm · 4 of 46 slices shown]
[im 10/46  vessel]
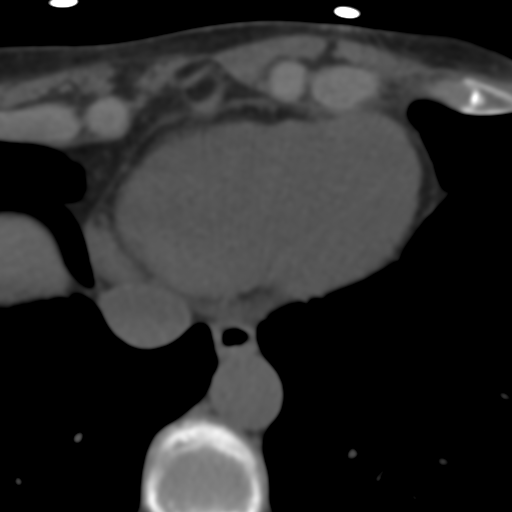
[im 19/46  vessel]
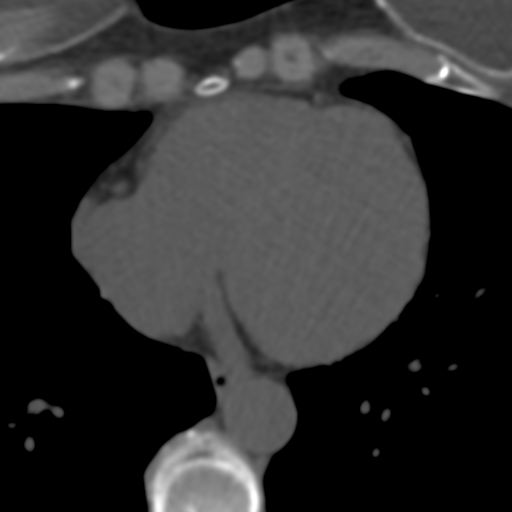
[im 28/46  vessel]
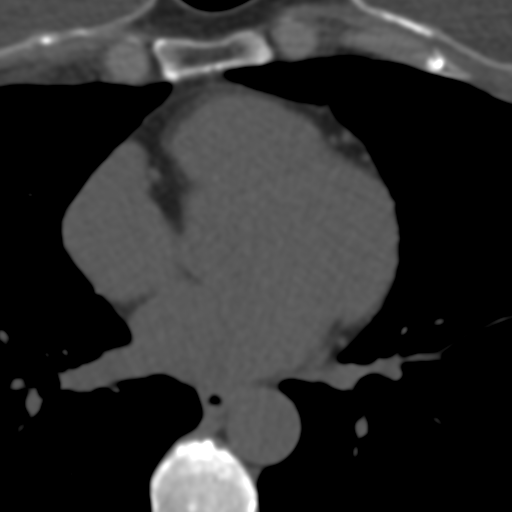
[im 37/46  vessel]
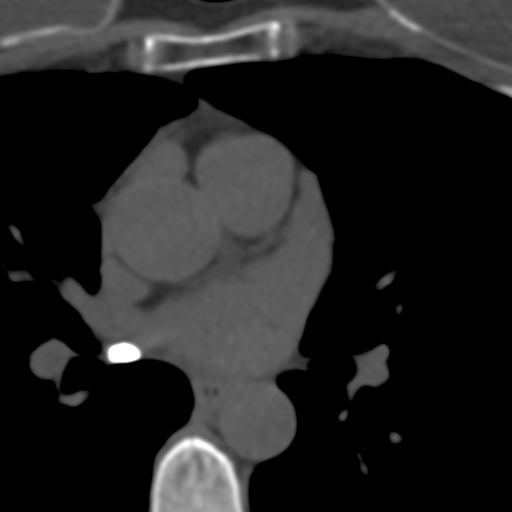

[Series 5: full fov st calcium scoring 3.00 · axial · 0.60mm/px · z∈[-1094,-1004]mm · 5 of 46 slices shown, 7 images]
[im 8/46  vessel]
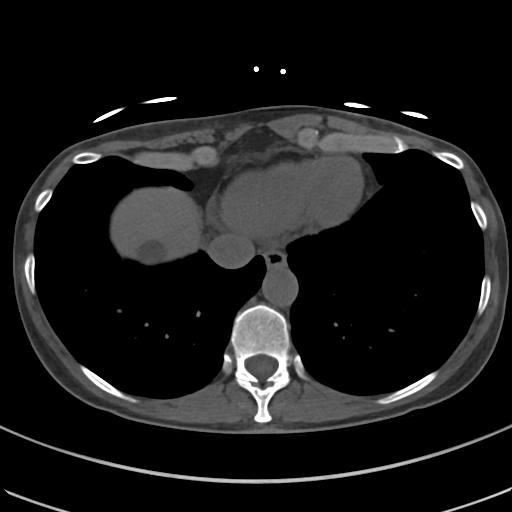
[im 8/46  lung]
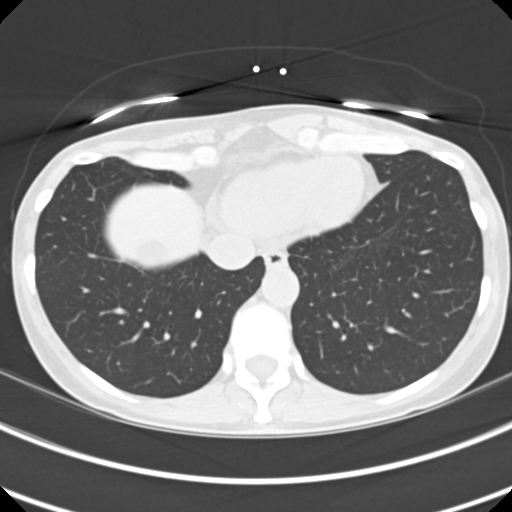
[im 16/46  vessel]
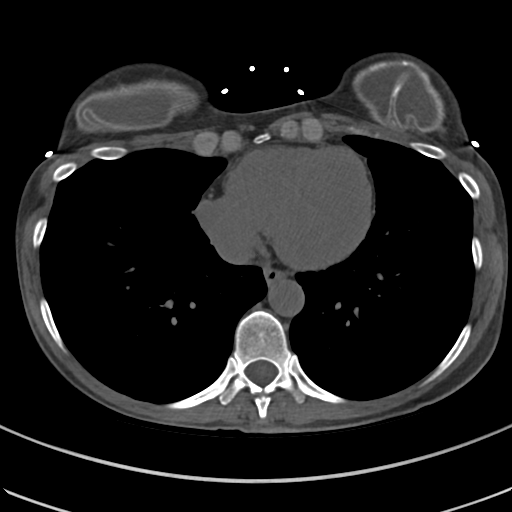
[im 23/46  vessel]
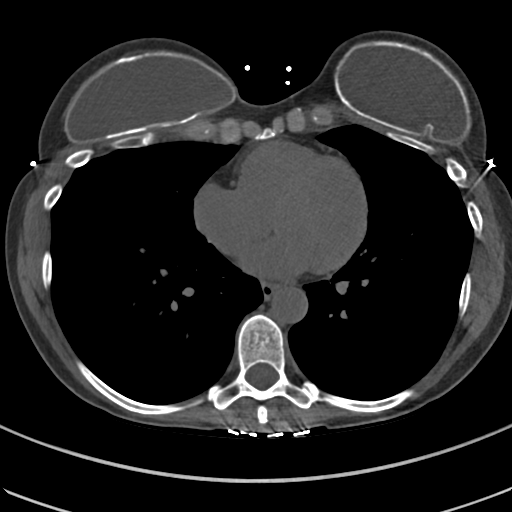
[im 31/46  vessel]
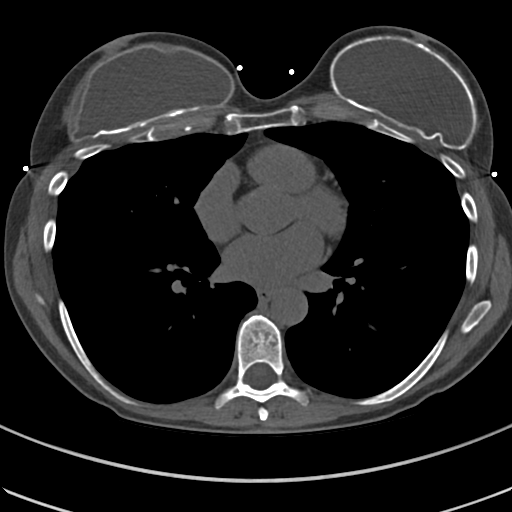
[im 38/46  vessel]
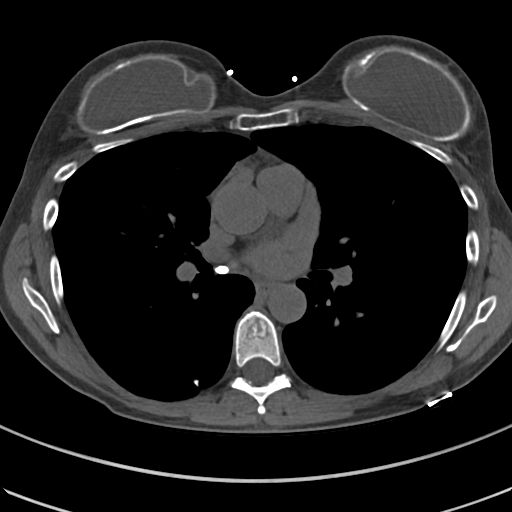
[im 38/46  lung]
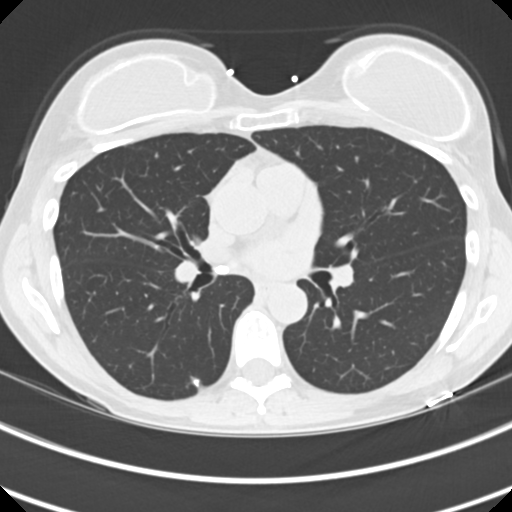

[Series 10: full fov lungs calcium scoring 3.00 ax · axial · 0.60mm/px · z∈[-1094,-1004]mm · 5 of 46 slices shown]
[im 8/46  vessel]
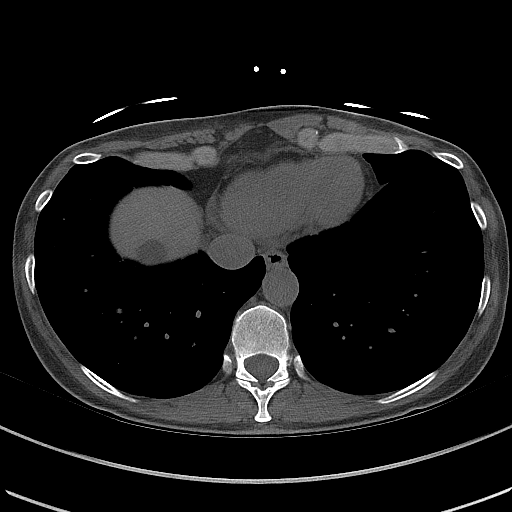
[im 16/46  vessel]
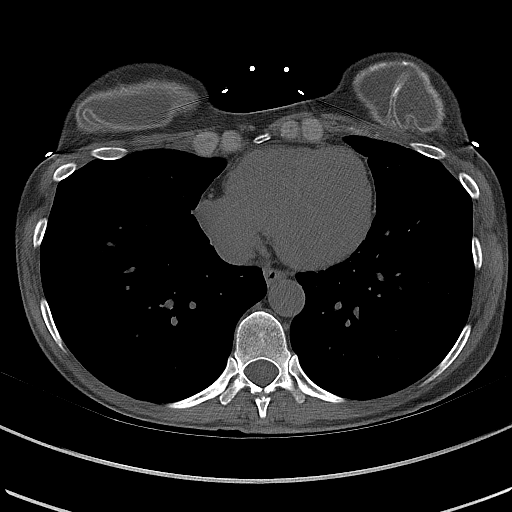
[im 23/46  vessel]
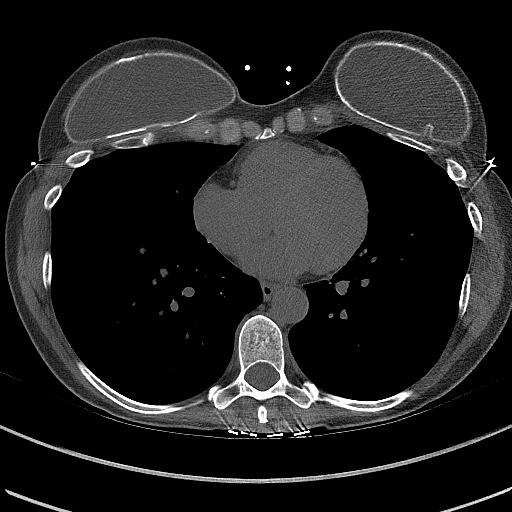
[im 31/46  vessel]
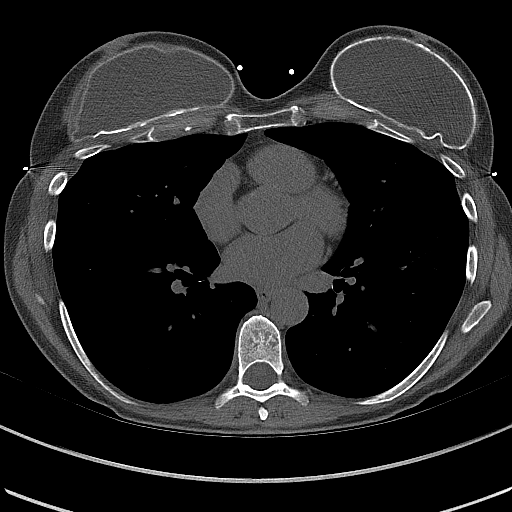
[im 38/46  vessel]
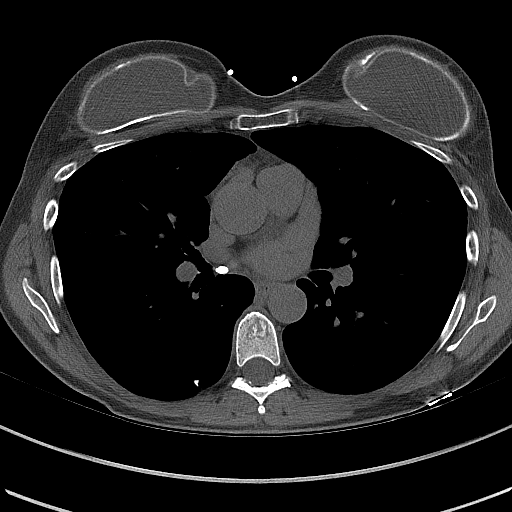

[14 of 20 positions shown; findings below may reference images not displayed]

FINDINGS: Small calcified granuloma in the right lower lobe incidentally
noted. Calcified right hilar and mediastinal lymph nodes are
incidentally noted. Within the visualized portions of the thorax
there are no suspicious appearing pulmonary nodules or masses, there
is no acute consolidative airspace disease, no pleural effusions, no
pneumothorax and no lymphadenopathy. Visualized portions of the
upper abdomen demonstrates low-attenuation lesions in the liver,
incompletely characterized on today's non-contrast CT examination,
but statistically likely to represent cysts, largest of which
measures up to 1.9 cm in segment 8. There are no aggressive
appearing lytic or blastic lesions noted in the visualized portions
of the skeleton. Bilateral breast implants are incidentally noted.
IMPRESSION: 1. Old granulomatous disease, as above.
FINDINGS: Non-cardiac: See separate report from [REDACTED].

Ascending Aorta: Normal size

Pericardium: Normal

Coronary arteries: Normal origin of left and right coronary
arteries. Distribution of arterial calcifications if present, as
noted below;

LM 0

LAD 0

LCx 0

RCA 0

Total 0

IMPRESSION AND RECOMMENDATION:
1. Normal coronary calcium score of 0. Patient is low risk for
coronary events.

2.  CAC 0, ZHANG SPARKMAN0.

3.  Continue heart healthy lifestyle and risk factor modification.

Seyeal Wan Gyo

*** End of Addendum ***
EXAM:
OVER-READ INTERPRETATION  CT CHEST

The following report is an over-read performed by radiologist Dr.
Seepersand Susty [REDACTED] on 03/27/2021. This
over-read does not include interpretation of cardiac or coronary
anatomy or pathology. The coronary calcium score/coronary CTA
interpretation by the cardiologist is attached.
FINDINGS: Small calcified granuloma in the right lower lobe incidentally
noted. Calcified right hilar and mediastinal lymph nodes are
incidentally noted. Within the visualized portions of the thorax
there are no suspicious appearing pulmonary nodules or masses, there
is no acute consolidative airspace disease, no pleural effusions, no
pneumothorax and no lymphadenopathy. Visualized portions of the
upper abdomen demonstrates low-attenuation lesions in the liver,
incompletely characterized on today's non-contrast CT examination,
but statistically likely to represent cysts, largest of which
measures up to 1.9 cm in segment 8. There are no aggressive
appearing lytic or blastic lesions noted in the visualized portions
of the skeleton. Bilateral breast implants are incidentally noted.
IMPRESSION: 1. Old granulomatous disease, as above.

## 2022-01-22 DIAGNOSIS — Z03818 Encounter for observation for suspected exposure to other biological agents ruled out: Secondary | ICD-10-CM | POA: Diagnosis not present

## 2022-01-22 DIAGNOSIS — Z20822 Contact with and (suspected) exposure to covid-19: Secondary | ICD-10-CM | POA: Diagnosis not present

## 2022-01-28 ENCOUNTER — Ambulatory Visit: Payer: 59 | Admitting: Dermatology

## 2022-03-25 ENCOUNTER — Other Ambulatory Visit: Payer: Self-pay | Admitting: Internal Medicine

## 2022-03-25 MED ORDER — SULFAMETHOXAZOLE-TRIMETHOPRIM 800-160 MG PO TABS: 1.0000 | ORAL_TABLET | Freq: Two times a day (BID) | ORAL | 0 refills | Status: DC

## 2022-04-24 ENCOUNTER — Telehealth: Payer: Self-pay | Admitting: Internal Medicine

## 2022-04-24 ENCOUNTER — Other Ambulatory Visit (INDEPENDENT_AMBULATORY_CARE_PROVIDER_SITE_OTHER): Payer: 59

## 2022-04-24 DIAGNOSIS — R300 Dysuria: Secondary | ICD-10-CM

## 2022-04-24 LAB — URINALYSIS, ROUTINE W REFLEX MICROSCOPIC
Bilirubin Urine: NEGATIVE
Hgb urine dipstick: NEGATIVE
Ketones, ur: NEGATIVE
Nitrite: POSITIVE — AB
Specific Gravity, Urine: 1.015 (ref 1.000–1.030)
Total Protein, Urine: NEGATIVE
Urine Glucose: NEGATIVE
Urobilinogen, UA: 0.2 (ref 0.0–1.0)
pH: 5.5 (ref 5.0–8.0)

## 2022-04-24 NOTE — Telephone Encounter (Signed)
She has been added to lab scehdule

## 2022-04-24 NOTE — Telephone Encounter (Signed)
Joy Reid has requested a UTI test today . She does not have a sterile container.  I have ordered the UA and culture . Can you put her on the  lab schedule ?

## 2022-04-25 LAB — URINE CULTURE
MICRO NUMBER:: 14224831
Result:: NO GROWTH
SPECIMEN QUALITY:: ADEQUATE

## 2022-04-26 ENCOUNTER — Other Ambulatory Visit: Payer: Self-pay | Admitting: Internal Medicine

## 2022-04-26 ENCOUNTER — Encounter: Payer: Self-pay | Admitting: Internal Medicine

## 2022-04-26 MED ORDER — ESTRADIOL 0.1 MG/GM VA CREA: TOPICAL_CREAM | VAGINAL | 12 refills | Status: DC

## 2022-05-14 NOTE — Telephone Encounter (Signed)
MyChart messgae sent to patient. 

## 2022-05-14 NOTE — Telephone Encounter (Signed)
Error

## 2022-06-21 ENCOUNTER — Encounter: Payer: Self-pay | Admitting: Internal Medicine

## 2022-06-21 ENCOUNTER — Other Ambulatory Visit: Payer: Self-pay | Admitting: Internal Medicine

## 2022-06-21 DIAGNOSIS — J329 Chronic sinusitis, unspecified: Secondary | ICD-10-CM | POA: Insufficient documentation

## 2022-06-21 MED ORDER — PREDNISONE 10 MG PO TABS: ORAL_TABLET | ORAL | 0 refills | Status: DC

## 2022-06-21 MED ORDER — AMOXICILLIN-POT CLAVULANATE 875-125 MG PO TABS: 1.0000 | ORAL_TABLET | Freq: Two times a day (BID) | ORAL | 0 refills | Status: DC

## 2022-07-31 ENCOUNTER — Telehealth: Payer: Self-pay

## 2022-07-31 NOTE — Telephone Encounter (Signed)
Appt has been scheduled for tomorrow at 11:30. Pt is aware of appt date and time.

## 2022-07-31 NOTE — Telephone Encounter (Signed)
LMTCB. Need to schedule pt an appt with Dr. Derrel Nip for a breast mass felt on self exam.

## 2022-08-01 ENCOUNTER — Encounter: Payer: Self-pay | Admitting: Internal Medicine

## 2022-08-01 ENCOUNTER — Ambulatory Visit (INDEPENDENT_AMBULATORY_CARE_PROVIDER_SITE_OTHER): Payer: 59

## 2022-08-01 ENCOUNTER — Ambulatory Visit: Payer: 59 | Admitting: Internal Medicine

## 2022-08-01 VITALS — BP 132/70 | HR 54 | Temp 97.8°F | Ht 65.0 in | Wt 126.0 lb

## 2022-08-01 DIAGNOSIS — M25532 Pain in left wrist: Secondary | ICD-10-CM | POA: Insufficient documentation

## 2022-08-01 DIAGNOSIS — N6321 Unspecified lump in the left breast, upper outer quadrant: Secondary | ICD-10-CM | POA: Insufficient documentation

## 2022-08-01 MED ORDER — ESTRADIOL 0.1 MG/GM VA CREA: TOPICAL_CREAM | VAGINAL | 6 refills | Status: AC

## 2022-08-01 NOTE — Assessment & Plan Note (Signed)
Plain films ordered to rule out wrist injury to scaphoid/lunate bones

## 2022-08-01 NOTE — Progress Notes (Signed)
Subjective:  Patient ID: Joy Reid, female    DOB: 1961-05-19  Age: 62 y.o. MRN: [id number]  CC: The primary encounter diagnosis was Acute wrist pain, left. A diagnosis of Mass of upper outer quadrant of left breast was also pertinent to this visit.   HPI Joy Reid presents for EVALUATION of a breast mass found on self exam yesterday Chief Complaint  Patient presents with   Breast Mass    During self exam pt found a mas in the left breast   Joy Reid is a health 62 yr old female with no history of breast cancer , remote breast augmentation who presents after feeling a pea sized mass in the upper outer quadrant of her left breast yesterday . Hr last mammogram was October 2021. She denies any recent breast changes.  Viral infections or e  Left wrist pain:  she fell forward onto her wrist by carrying a rolled carpet out of the home and hyperextended her wrist .  Since then she has  been having pain with rotation of wrist and pain with flexion of ring and middle finger.      Outpatient Medications Prior to Visit  Medication Sig Dispense Refill   OVER THE COUNTER MEDICATION Vitamin B complex, one tablet daily.     OVER THE COUNTER MEDICATION Magnesium one tablet daily.     OVER THE COUNTER MEDICATION Vitamin K 2 D3, one capsule daily.     OVER THE COUNTER MEDICATION Turmeric and ginger, one capsule daily.     OVER THE COUNTER MEDICATION Fish Oil one capsule daily.     estradiol (ESTRACE VAGINAL) 0.1 MG/GM vaginal cream Place 1 gram intravaginally at night for 2 weeks,  then twice weekly thereafter 42.5 g 12   gabapentin (NEURONTIN) 100 MG capsule Take 2 capsules (200 mg total) by mouth at bedtime. (Patient not taking: Reported on 08/01/2022) 180 capsule 3   amoxicillin-clavulanate (AUGMENTIN) 875-125 MG tablet Take 1 tablet by mouth 2 (two) times daily. (Patient not taking: Reported on 08/01/2022) 14 tablet 0   clotrimazole (LOTRIMIN) 1 % cream Apply to affected area 2 times daily (Patient  not taking: Reported on 08/01/2022) 15 g 0   predniSONE (DELTASONE) 10 MG tablet 6 tablets on Day 1 , then reduce by 1 tablet daily until gone (Patient not taking: Reported on 08/01/2022) 21 tablet 0   No facility-administered medications prior to visit.    Review of Systems;  Patient denies headache, fevers, malaise, unintentional weight loss, skin rash, eye pain, sinus congestion and sinus pain, sore throat, dysphagia,  hemoptysis , cough, dyspnea, wheezing, chest pain, palpitations, orthopnea, edema, abdominal pain, nausea, melena, diarrhea, constipation, flank pain, dysuria, hematuria, urinary  Frequency, nocturia, numbness, tingling, seizures,  Focal weakness, Loss of consciousness,  Tremor, insomnia, depression, anxiety, and suicidal ideation.      Objective:  BP 132/70   Pulse (!) 54   Temp 97.8 F (36.6 C) (Oral)   Ht '5\' 5"'$  (1.651 m)   Wt 126 lb (57.2 kg)   SpO2 98%   BMI 20.97 kg/m   BP Readings from Last 3 Encounters:  08/01/22 132/70  08/29/21 119/79  07/02/21 (!) 118/51    Wt Readings from Last 3 Encounters:  08/01/22 126 lb (57.2 kg)  07/02/21 118 lb (53.5 kg)  06/18/21 118 lb (53.5 kg)    Physical Exam Vitals reviewed.  Constitutional:      General: She is not in acute distress.    Appearance: Normal  appearance. She is well-developed and normal weight. She is not ill-appearing, toxic-appearing or diaphoretic.  HENT:     Head: Normocephalic.     Right Ear: Tympanic membrane, ear canal and external ear normal. There is no impacted cerumen.     Left Ear: Tympanic membrane, ear canal and external ear normal. There is no impacted cerumen.     Nose: Nose normal.     Mouth/Throat:     Mouth: Mucous membranes are moist.     Pharynx: Oropharynx is clear.  Eyes:     General: No scleral icterus.       Right eye: No discharge.        Left eye: No discharge.     Conjunctiva/sclera: Conjunctivae normal.     Pupils: Pupils are equal, round, and reactive to light.   Neck:     Thyroid: No thyromegaly.     Vascular: No carotid bruit or JVD.  Cardiovascular:     Rate and Rhythm: Normal rate and regular rhythm.     Heart sounds: Normal heart sounds.  Pulmonary:     Effort: Pulmonary effort is normal. No respiratory distress.     Breath sounds: Normal breath sounds.  Chest:  Breasts:    Breasts are symmetrical.     Right: Normal. No swelling, inverted nipple, mass, nipple discharge, skin change or tenderness.     Left: Normal. No swelling, inverted nipple, mass, nipple discharge, skin change or tenderness.       Comments: BB sized mass at LUO quadrant  Abdominal:     General: Bowel sounds are normal.     Palpations: Abdomen is soft. There is no mass.     Tenderness: There is no abdominal tenderness. There is no guarding or rebound.     Hernia: There is no hernia in the left inguinal area or right inguinal area.  Genitourinary:    Exam position: Lithotomy position.     Pubic Area: No rash or pubic lice.      Labia:        Right: No rash, tenderness, lesion or injury.        Left: No rash, tenderness, lesion or injury.      Vagina: Normal.     Cervix: Normal.     Uterus: Normal.      Adnexa: Right adnexa normal and left adnexa normal.  Musculoskeletal:        General: Normal range of motion.     Left wrist: Normal.     Cervical back: Normal range of motion and neck supple.     Comments: Pain elcited with  forced extension of fingers 4 and 3   Lymphadenopathy:     Cervical: No cervical adenopathy.     Upper Body:     Right upper body: No supraclavicular, axillary or pectoral adenopathy.     Left upper body: No supraclavicular, axillary or pectoral adenopathy.     Lower Body: No right inguinal adenopathy. No left inguinal adenopathy.  Skin:    General: Skin is warm and dry.  Neurological:     General: No focal deficit present.     Mental Status: She is alert and oriented to person, place, and time. Mental status is at baseline.   Psychiatric:        Mood and Affect: Mood normal.        Behavior: Behavior normal.        Thought Content: Thought content normal.  Judgment: Judgment normal.    Lab Results  Component Value Date   HGBA1C 5.7 04/11/2020   HGBA1C 5.4 05/31/2019    Lab Results  Component Value Date   CREATININE 0.59 04/23/2021   CREATININE 0.59 04/11/2020   CREATININE 0.68 06/03/2019    Lab Results  Component Value Date   WBC 6.9 04/11/2020   HGB 14.3 04/11/2020   HCT 42.9 04/11/2020   PLT 247.0 04/11/2020   GLUCOSE 87 04/23/2021   CHOL 241 (H) 04/23/2021   TRIG 97.0 04/23/2021   HDL 86.00 04/23/2021   LDLCALC 136 (H) 04/23/2021   ALT 15 04/23/2021   AST 23 04/23/2021   NA 140 04/23/2021   K 4.5 04/23/2021   CL 104 04/23/2021   CREATININE 0.59 04/23/2021   BUN 13 04/23/2021   CO2 31 04/23/2021   TSH 0.93 04/11/2020   HGBA1C 5.7 04/11/2020    No results found.  Assessment & Plan:  .Acute wrist pain, left -     DG Wrist 2 Views Left; Future  Mass of upper outer quadrant of left breast -     MM DIAG BREAST TOMO BILATERAL; Future  Other orders -     Estradiol; Place 1 gram twice weekly  Dispense: 42.5 g; Refill: 6   Follow-up: No follow-ups on file.   Crecencio Mc, MD

## 2022-08-01 NOTE — Patient Instructions (Signed)
I have ordered a diagnostic mammogram of both breasts (required by facility) as well as ultraounds

## 2022-08-01 NOTE — Assessment & Plan Note (Signed)
Diagnostic mammogram and ultrasound pf both breasts ordered.  Last screening 2021

## 2022-08-06 ENCOUNTER — Other Ambulatory Visit: Payer: Self-pay | Admitting: Internal Medicine

## 2022-08-06 DIAGNOSIS — N63 Unspecified lump in unspecified breast: Secondary | ICD-10-CM

## 2022-08-12 DIAGNOSIS — S63502A Unspecified sprain of left wrist, initial encounter: Secondary | ICD-10-CM | POA: Diagnosis not present

## 2022-08-12 DIAGNOSIS — M2391 Unspecified internal derangement of right knee: Secondary | ICD-10-CM | POA: Diagnosis not present

## 2022-08-12 DIAGNOSIS — M7711 Lateral epicondylitis, right elbow: Secondary | ICD-10-CM | POA: Diagnosis not present

## 2022-08-12 DIAGNOSIS — M25531 Pain in right wrist: Secondary | ICD-10-CM | POA: Diagnosis not present

## 2022-08-19 ENCOUNTER — Ambulatory Visit
Admission: RE | Admit: 2022-08-19 | Discharge: 2022-08-19 | Disposition: A | Discharge status: Home / Self Care | Payer: 59 | Source: Ambulatory Visit | Attending: Internal Medicine | Admitting: Internal Medicine

## 2022-08-19 ENCOUNTER — Other Ambulatory Visit: Payer: Self-pay | Admitting: Internal Medicine

## 2022-08-19 DIAGNOSIS — N63 Unspecified lump in unspecified breast: Secondary | ICD-10-CM | POA: Insufficient documentation

## 2022-08-19 DIAGNOSIS — N6321 Unspecified lump in the left breast, upper outer quadrant: Secondary | ICD-10-CM

## 2022-08-19 DIAGNOSIS — R928 Other abnormal and inconclusive findings on diagnostic imaging of breast: Secondary | ICD-10-CM | POA: Diagnosis not present

## 2022-08-19 DIAGNOSIS — Z0389 Encounter for observation for other suspected diseases and conditions ruled out: Secondary | ICD-10-CM | POA: Diagnosis not present

## 2022-08-19 DIAGNOSIS — N6489 Other specified disorders of breast: Secondary | ICD-10-CM | POA: Diagnosis not present

## 2022-08-27 DIAGNOSIS — M2391 Unspecified internal derangement of right knee: Secondary | ICD-10-CM | POA: Diagnosis not present

## 2022-09-02 DIAGNOSIS — M67834 Other specified disorders of tendon, left wrist: Secondary | ICD-10-CM | POA: Diagnosis not present

## 2022-09-09 DIAGNOSIS — M2241 Chondromalacia patellae, right knee: Secondary | ICD-10-CM | POA: Diagnosis not present

## 2022-09-11 DIAGNOSIS — D2371 Other benign neoplasm of skin of right lower limb, including hip: Secondary | ICD-10-CM | POA: Diagnosis not present

## 2022-09-11 DIAGNOSIS — D485 Neoplasm of uncertain behavior of skin: Secondary | ICD-10-CM | POA: Diagnosis not present

## 2022-09-11 DIAGNOSIS — D692 Other nonthrombocytopenic purpura: Secondary | ICD-10-CM | POA: Diagnosis not present

## 2022-12-18 ENCOUNTER — Telehealth: Payer: Self-pay | Admitting: Internal Medicine

## 2022-12-18 NOTE — Telephone Encounter (Signed)
Can you schedule Joy Reid for a follow up visit on abdominal pain? You can use a Tuesday morning 9:30 slot .  Not urgent but needs to be in the next 4 weeks .  Thank you

## 2022-12-18 NOTE — Telephone Encounter (Signed)
Spoke with pt and scheduled her an appt.

## 2022-12-25 ENCOUNTER — Encounter: Payer: Self-pay | Admitting: Internal Medicine

## 2022-12-25 ENCOUNTER — Ambulatory Visit: Payer: 59 | Admitting: Internal Medicine

## 2022-12-25 VITALS — BP 110/54 | HR 61 | Temp 98.3°F | Ht 65.0 in | Wt 125.0 lb

## 2022-12-25 DIAGNOSIS — E559 Vitamin D deficiency, unspecified: Secondary | ICD-10-CM

## 2022-12-25 DIAGNOSIS — Z23 Encounter for immunization: Secondary | ICD-10-CM | POA: Diagnosis not present

## 2022-12-25 DIAGNOSIS — E0789 Other specified disorders of thyroid: Secondary | ICD-10-CM

## 2022-12-25 DIAGNOSIS — E782 Mixed hyperlipidemia: Secondary | ICD-10-CM

## 2022-12-25 DIAGNOSIS — K2100 Gastro-esophageal reflux disease with esophagitis, without bleeding: Secondary | ICD-10-CM | POA: Diagnosis not present

## 2022-12-25 DIAGNOSIS — M542 Cervicalgia: Secondary | ICD-10-CM | POA: Diagnosis not present

## 2022-12-25 DIAGNOSIS — M47817 Spondylosis without myelopathy or radiculopathy, lumbosacral region: Secondary | ICD-10-CM

## 2022-12-25 NOTE — Progress Notes (Signed)
Subjective:  Patient ID: Joy Reid, female    DOB: 1961-02-03  Age: 62 y.o. MRN: [id number]  CC: The primary encounter diagnosis was Painful thyroid. Diagnoses of Vitamin D deficiency, Moderate mixed hyperlipidemia not requiring statin therapy, Encounter for immunization, Spondylosis of lumbosacral region, unspecified spinal osteoarthritis complication status, Anterior neck pain, and Gastroesophageal reflux disease with esophagitis without hemorrhage were also pertinent to this visit.   HPI Joy Reid presents for  Chief Complaint  Patient presents with   Back Pain    Lower back pain    62 YR OLD HEALTHY ACTIVE FEMALE presents with low back pain  that is present upon waking and improves with activity. Localized to the SI joint on the right.  No history of trauma.  No leg weakness or cramping   2)  occasional esophageal pain brought on by eating too fast,  eating late at night.  Outside of throat feels tender   Outpatient Medications Prior to Visit  Medication Sig Dispense Refill   estradiol (ESTRACE VAGINAL) 0.1 MG/GM vaginal cream Place 1 gram twice weekly 42.5 g 6   OVER THE COUNTER MEDICATION Vitamin B complex, one tablet daily.     OVER THE COUNTER MEDICATION Magnesium one tablet daily.     OVER THE COUNTER MEDICATION Vitamin K 2 D3, one capsule daily.     OVER THE COUNTER MEDICATION Turmeric and ginger, one capsule daily.     OVER THE COUNTER MEDICATION Fish Oil one capsule daily.     gabapentin (NEURONTIN) 100 MG capsule Take 2 capsules (200 mg total) by mouth at bedtime. (Patient not taking: Reported on 08/01/2022) 180 capsule 3   No facility-administered medications prior to visit.    Review of Systems;  Patient denies headache, fevers, malaise, unintentional weight loss, skin rash, eye pain, sinus congestion and sinus pain, sore throat, dysphagia,  hemoptysis , cough, dyspnea, wheezing, chest pain, palpitations, orthopnea, edema, abdominal pain, nausea, melena,  diarrhea, constipation, flank pain, dysuria, hematuria, urinary  Frequency, nocturia, numbness, tingling, seizures,  Focal weakness, Loss of consciousness,  Tremor, insomnia, depression, anxiety, and suicidal ideation.      Objective:  BP (!) 110/54   Pulse 61   Temp 98.3 F (36.8 C) (Oral)   Ht 5\' 5"  (1.651 m)   Wt 125 lb (56.7 kg)   SpO2 97%   BMI 20.80 kg/m   BP Readings from Last 3 Encounters:  12/25/22 (!) 110/54  08/01/22 132/70  08/29/21 119/79    Wt Readings from Last 3 Encounters:  12/25/22 125 lb (56.7 kg)  08/01/22 126 lb (57.2 kg)  07/02/21 118 lb (53.5 kg)    Physical Exam Vitals reviewed.  Constitutional:      General: She is not in acute distress.    Appearance: Normal appearance. She is normal weight. She is not ill-appearing, toxic-appearing or diaphoretic.  HENT:     Head: Normocephalic.  Eyes:     General: No scleral icterus.       Right eye: No discharge.        Left eye: No discharge.     Conjunctiva/sclera: Conjunctivae normal.  Cardiovascular:     Rate and Rhythm: Normal rate and regular rhythm.     Heart sounds: Normal heart sounds.  Pulmonary:     Effort: Pulmonary effort is normal. No respiratory distress.     Breath sounds: Normal breath sounds.  Musculoskeletal:        General: Normal range of motion.  Skin:  General: Skin is warm and dry.  Neurological:     General: No focal deficit present.     Mental Status: She is alert and oriented to person, place, and time. Mental status is at baseline.  Psychiatric:        Mood and Affect: Mood normal.        Behavior: Behavior normal.        Thought Content: Thought content normal.        Judgment: Judgment normal.    Lab Results  Component Value Date   HGBA1C 5.7 04/11/2020   HGBA1C 5.4 05/31/2019    Lab Results  Component Value Date   CREATININE 0.59 04/23/2021   CREATININE 0.59 04/11/2020   CREATININE 0.68 06/03/2019    Lab Results  Component Value Date   WBC 6.9  04/11/2020   HGB 14.3 04/11/2020   HCT 42.9 04/11/2020   PLT 247.0 04/11/2020   GLUCOSE 87 04/23/2021   CHOL 241 (H) 04/23/2021   TRIG 97.0 04/23/2021   HDL 86.00 04/23/2021   LDLCALC 136 (H) 04/23/2021   ALT 15 04/23/2021   AST 23 04/23/2021   NA 140 04/23/2021   K 4.5 04/23/2021   CL 104 04/23/2021   CREATININE 0.59 04/23/2021   BUN 13 04/23/2021   CO2 31 04/23/2021   TSH 1.11 12/27/2022   HGBA1C 5.7 04/11/2020    MM DIAG BREAST W/IMPLANT TOMO BILATERAL  Result Date: 08/19/2022 CLINICAL DATA:  62 year old female presenting for evaluation of a lump in the upper outer left breast which she can no longer feel as well as a possible lump in the low right axilla felt by her provider. EXAM: DIGITAL DIAGNOSTIC BILATERAL MAMMOGRAM WITH IMPLANTS, TOMOSYNTHESIS; ULTRASOUND LEFT BREAST LIMITED; [place] AXILLARY RIGHT TECHNIQUE: Bilateral digital diagnostic mammography and breast tomosynthesis was performed. Standard and/or implant displaced views were performed.; Targeted ultrasound examination of the left breast was performed.; Targeted ultrasound examination of the right axilla was performed. COMPARISON:  Previous exam(s). ACR Breast Density Category b: There are scattered areas of fibroglandular density. FINDINGS: Mammogram: The patient has retropectoral implants. Right breast: No suspicious mass, distortion, or microcalcifications are identified to suggest presence of malignancy. Specifically there is no new finding in visualized portion of the low right axilla. Left breast: No suspicious mass, distortion, or microcalcifications are identified to suggest presence of malignancy. Specifically there is no new finding in the upper outer quadrant of the left breast. On physical exam of the low right axilla and left upper outer breast I do not feel a discrete mass or focal area of thickening. Ultrasound: Targeted ultrasound is performed throughout the upper-outer quadrant of the left breast and in the right  axilla demonstrating no cystic or solid mass. Normal lymph nodes are visualized in the right axilla. IMPRESSION: No mammographic or sonographic evidence of malignancy or other imaging abnormality in the upper outer left breast or right axilla. RECOMMENDATION: 1. Recommend any further workup of the palpable sites be on a clinical basis given negative imaging findings. 2.  Screening mammogram in one year.(Code:SM-B-01Y) I have discussed the findings and recommendations with the patient. If applicable, a reminder letter will be sent to the patient regarding the next appointment. BI-RADS CATEGORY  1: Negative. Electronically Signed   By: Emmaline Kluver M.D.   On: 08/19/2022 11:46   US BREAST LTD UNI LEFT INC AXILLA  Result Date: 08/19/2022 CLINICAL DATA:  62 year old female presenting for evaluation of a lump in the upper outer left breast which she can  no longer feel as well as a possible lump in the low right axilla felt by her provider. EXAM: DIGITAL DIAGNOSTIC BILATERAL MAMMOGRAM WITH IMPLANTS, TOMOSYNTHESIS; ULTRASOUND LEFT BREAST LIMITED; [place] AXILLARY RIGHT TECHNIQUE: Bilateral digital diagnostic mammography and breast tomosynthesis was performed. Standard and/or implant displaced views were performed.; Targeted ultrasound examination of the left breast was performed.; Targeted ultrasound examination of the right axilla was performed. COMPARISON:  Previous exam(s). ACR Breast Density Category b: There are scattered areas of fibroglandular density. FINDINGS: Mammogram: The patient has retropectoral implants. Right breast: No suspicious mass, distortion, or microcalcifications are identified to suggest presence of malignancy. Specifically there is no new finding in visualized portion of the low right axilla. Left breast: No suspicious mass, distortion, or microcalcifications are identified to suggest presence of malignancy. Specifically there is no new finding in the upper outer quadrant of the left breast. On  physical exam of the low right axilla and left upper outer breast I do not feel a discrete mass or focal area of thickening. Ultrasound: Targeted ultrasound is performed throughout the upper-outer quadrant of the left breast and in the right axilla demonstrating no cystic or solid mass. Normal lymph nodes are visualized in the right axilla. IMPRESSION: No mammographic or sonographic evidence of malignancy or other imaging abnormality in the upper outer left breast or right axilla. RECOMMENDATION: 1. Recommend any further workup of the palpable sites be on a clinical basis given negative imaging findings. 2.  Screening mammogram in one year.(Code:SM-B-01Y) I have discussed the findings and recommendations with the patient. If applicable, a reminder letter will be sent to the patient regarding the next appointment. BI-RADS CATEGORY  1: Negative. Electronically Signed   By: Emmaline Kluver M.D.   On: 08/19/2022 11:46  [place] AXILLA RIGHT  Result Date: 08/19/2022 CLINICAL DATA:  62 year old female presenting for evaluation of a lump in the upper outer left breast which she can no longer feel as well as a possible lump in the low right axilla felt by her provider. EXAM: DIGITAL DIAGNOSTIC BILATERAL MAMMOGRAM WITH IMPLANTS, TOMOSYNTHESIS; ULTRASOUND LEFT BREAST LIMITED; [place] AXILLARY RIGHT TECHNIQUE: Bilateral digital diagnostic mammography and breast tomosynthesis was performed. Standard and/or implant displaced views were performed.; Targeted ultrasound examination of the left breast was performed.; Targeted ultrasound examination of the right axilla was performed. COMPARISON:  Previous exam(s). ACR Breast Density Category b: There are scattered areas of fibroglandular density. FINDINGS: Mammogram: The patient has retropectoral implants. Right breast: No suspicious mass, distortion, or microcalcifications are identified to suggest presence of malignancy. Specifically there is no new finding in visualized portion of the  low right axilla. Left breast: No suspicious mass, distortion, or microcalcifications are identified to suggest presence of malignancy. Specifically there is no new finding in the upper outer quadrant of the left breast. On physical exam of the low right axilla and left upper outer breast I do not feel a discrete mass or focal area of thickening. Ultrasound: Targeted ultrasound is performed throughout the upper-outer quadrant of the left breast and in the right axilla demonstrating no cystic or solid mass. Normal lymph nodes are visualized in the right axilla. IMPRESSION: No mammographic or sonographic evidence of malignancy or other imaging abnormality in the upper outer left breast or right axilla. RECOMMENDATION: 1. Recommend any further workup of the palpable sites be on a clinical basis given negative imaging findings. 2.  Screening mammogram in one year.(Code:SM-B-01Y) I have discussed the findings and recommendations with the patient. If applicable, a reminder letter will  be sent to the patient regarding the next appointment. BI-RADS CATEGORY  1: Negative. Electronically Signed   By: Emmaline Kluver M.D.   On: 08/19/2022 11:46   Assessment & Plan:  .Painful thyroid -     Thyroglobulin antibody -     Thyroid peroxidase antibody -     Thyroglobulin Level -     Sedimentation rate -     C-reactive protein  Vitamin D deficiency -     VITAMIN D 25 Hydroxy (Vit-D Deficiency, Fractures); Future  Moderate mixed hyperlipidemia not requiring statin therapy -     Lipid panel; Future -     Comprehensive metabolic panel; Future -     TSH; Future  Encounter for immunization -     Tdap vaccine greater than or equal to 7yo IM  Spondylosis of lumbosacral region, unspecified spinal osteoarthritis complication status Assessment & Plan: SI joint.  . Reassurance provided    Anterior neck pain Assessment & Plan: Nosign of thyroiditis by labs.  Lab Results  Component Value Date   TSH 1.11  12/27/2022   Lab Results  Component Value Date   TSH 1.11 12/27/2022   T4TOTAL 9.1 05/31/2019      Gastroesophageal reflux disease with esophagitis without hemorrhage Assessment & Plan:  by history.  Patient is advised to stat taking omeprazole daily and avoiding foods and behaviors that trigger her reflux symptoms.  She denies any recent episodes of chest pain  . behavioral changes discussed  And advised to not eat within within 2 hours of lying down,,  Avoid overeating,  And avoid chocolate and mint at the end of meal        Follow-up: Return in about 6 months (around 06/27/2023).   Sherlene Shams, MD

## 2022-12-25 NOTE — Patient Instructions (Addendum)
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

## 2022-12-26 LAB — THYROGLOBULIN ANTIBODY: Thyroglobulin Ab: <1 IU/mL (ref <=1)

## 2022-12-26 LAB — THYROGLOBULIN LEVEL: Thyroglobulin: 19.6 ng/mL

## 2022-12-26 LAB — C-REACTIVE PROTEIN: CRP: <1 mg/dL (ref 0.5–20.0)

## 2022-12-26 LAB — THYROID PEROXIDASE ANTIBODY: Thyroperoxidase Ab SerPl-aCnc: <1 IU/mL (ref <9)

## 2022-12-26 LAB — SEDIMENTATION RATE: Sed Rate: 6 mm/hr (ref 0–30)

## 2022-12-27 ENCOUNTER — Ambulatory Visit (INDEPENDENT_AMBULATORY_CARE_PROVIDER_SITE_OTHER): Payer: 59

## 2022-12-27 DIAGNOSIS — M479 Spondylosis, unspecified: Secondary | ICD-10-CM | POA: Insufficient documentation

## 2022-12-27 DIAGNOSIS — M542 Cervicalgia: Secondary | ICD-10-CM | POA: Insufficient documentation

## 2022-12-27 DIAGNOSIS — E782 Mixed hyperlipidemia: Secondary | ICD-10-CM

## 2022-12-27 DIAGNOSIS — K2100 Gastro-esophageal reflux disease with esophagitis, without bleeding: Secondary | ICD-10-CM | POA: Insufficient documentation

## 2022-12-27 LAB — TSH: TSH: 1.11 u[IU]/mL (ref 0.35–5.50)

## 2022-12-27 NOTE — Assessment & Plan Note (Signed)
by history.  Patient is advised to stat taking omeprazole daily and avoiding foods and behaviors that trigger her reflux symptoms.  She denies any recent episodes of chest pain  . behavioral changes discussed  And advised to not eat within within 2 hours of lying down,,  Avoid overeating,  And avoid chocolate and mint at the end of meal

## 2022-12-27 NOTE — Assessment & Plan Note (Signed)
SI joint.  . Reassurance provided

## 2022-12-27 NOTE — Assessment & Plan Note (Addendum)
Nosign of thyroiditis by labs.  Lab Results  Component Value Date   TSH 1.11 12/27/2022   Lab Results  Component Value Date   TSH 1.11 12/27/2022   T4TOTAL 9.1 05/31/2019

## 2022-12-30 ENCOUNTER — Ambulatory Visit: Payer: 59 | Admitting: Podiatry

## 2023-01-14 ENCOUNTER — Ambulatory Visit: Payer: 59 | Admitting: Podiatry

## 2023-01-14 ENCOUNTER — Encounter: Payer: Self-pay | Admitting: Podiatry

## 2023-01-14 ENCOUNTER — Ambulatory Visit (INDEPENDENT_AMBULATORY_CARE_PROVIDER_SITE_OTHER): Payer: 59

## 2023-01-14 DIAGNOSIS — M2041 Other hammer toe(s) (acquired), right foot: Secondary | ICD-10-CM

## 2023-01-14 DIAGNOSIS — M2042 Other hammer toe(s) (acquired), left foot: Secondary | ICD-10-CM

## 2023-01-14 DIAGNOSIS — B070 Plantar wart: Secondary | ICD-10-CM | POA: Diagnosis not present

## 2023-01-14 NOTE — Progress Notes (Signed)
   Chief Complaint  Patient presents with   Foot Pain    "I have some corns and calluses." N - corns and calluses L - 5th bilateral, plantar left D - 1 year O - gradually worse C - tender, sore A - movement especially after I've played a sport T - medicated corn pad, I pick at them    HPI: 62 y.o. female presenting today with a chief complaint of painful callus lesions noted to the 5th right toe and 4th left toe that have been present for the past few months. Walking and wearing shoes increases the pain. She has not had any treatment for the symptoms.  She also notes her toes do not spread apart enough when she walks.  Patient was last seen in our office 01/26/2019 for the same condition.   Past Medical History:  Diagnosis Date   GERD (gastroesophageal reflux disease)    Spontaneous pneumothorax    as a teenager, bilateral (sequential) s/p surgical stapling   Spontaneous pneumothorax    as a teenager, bilateral (sequential) s/p surgical stapling     Physical Exam: General: The patient is alert and oriented x3 in no acute distress.  Dermatology: Skin is warm, dry and supple bilateral lower extremities. Negative for open lesions or macerations.  Small plantar verruca lesion noted to the plantar aspect of the first MTP of the left foot.  It measures approximately 0.3 cm in diameter.  This is different from the other warts throughout the patient's foot.  Vascular: Palpable pedal pulses bilaterally. No edema or erythema noted. Capillary refill within normal limits.  Neurological: Gross intact via light touch.   Musculoskeletal Exam: Increased medial longitudinal arch noted bilaterally. Range of motion within normal limits to all pedal and ankle joints bilateral. Muscle strength 5/5 in all groups bilateral.   Radiographic exam B/L feet 01/14/2023: Normal osseous mineralization.  Joint spaces preserved.  Increased medial longitudinal arch noted with hammertoe  contracture  Assessment: 1. Cavus foot type bilateral 2.  Hammertoes bilateral 3.  Symptomatic interdigital calluses bilateral 4.  Plantar verruca left foot  Plan of Care:  -Patient evaluated.  X-rays reviewed -Excisional debridement of the hyperkeratotic calluses was performed today using a 312 scalpel without incident or bleeding -Excisional debridement of the plantar verruca lesion was also performed today using a 312 scalpel without incident or bleeding.  Pinpoint bleeding upon debridement.  Salicylic acid was applied with a Band-Aid -Recommend OTC wart remover daily -Return to clinic as needed  *Real Estate Agent      Felecia Shelling, DPM [facility]  Dr. Felecia Shelling, DPM    [address] [place], [place] [address]                Office [phone]  Fax [phone]

## 2023-07-31 ENCOUNTER — Other Ambulatory Visit: Payer: Self-pay | Admitting: Internal Medicine

## 2023-07-31 DIAGNOSIS — Z1231 Encounter for screening mammogram for malignant neoplasm of breast: Secondary | ICD-10-CM

## 2023-10-21 ENCOUNTER — Ambulatory Visit
Admission: RE | Admit: 2023-10-21 | Discharge: 2023-10-21 | Disposition: A | Discharge status: Home / Self Care | Source: Ambulatory Visit | Attending: Internal Medicine | Admitting: Internal Medicine

## 2023-10-21 DIAGNOSIS — Z1231 Encounter for screening mammogram for malignant neoplasm of breast: Secondary | ICD-10-CM | POA: Insufficient documentation

## 2023-10-30 ENCOUNTER — Encounter: Payer: Self-pay | Admitting: Internal Medicine

## 2023-10-30 DIAGNOSIS — G8929 Other chronic pain: Secondary | ICD-10-CM

## 2023-10-30 NOTE — Telephone Encounter (Signed)
 Referral has been pended.

## 2023-11-19 DIAGNOSIS — M9901 Segmental and somatic dysfunction of cervical region: Secondary | ICD-10-CM | POA: Diagnosis not present

## 2023-11-19 DIAGNOSIS — M9903 Segmental and somatic dysfunction of lumbar region: Secondary | ICD-10-CM | POA: Diagnosis not present

## 2023-11-19 DIAGNOSIS — M5412 Radiculopathy, cervical region: Secondary | ICD-10-CM | POA: Diagnosis not present

## 2023-11-19 DIAGNOSIS — M5432 Sciatica, left side: Secondary | ICD-10-CM | POA: Diagnosis not present

## 2023-11-20 DIAGNOSIS — M9901 Segmental and somatic dysfunction of cervical region: Secondary | ICD-10-CM | POA: Diagnosis not present

## 2023-11-20 DIAGNOSIS — M9903 Segmental and somatic dysfunction of lumbar region: Secondary | ICD-10-CM | POA: Diagnosis not present

## 2023-11-20 DIAGNOSIS — M5412 Radiculopathy, cervical region: Secondary | ICD-10-CM | POA: Diagnosis not present

## 2023-11-20 DIAGNOSIS — M5432 Sciatica, left side: Secondary | ICD-10-CM | POA: Diagnosis not present

## 2023-11-24 DIAGNOSIS — M5412 Radiculopathy, cervical region: Secondary | ICD-10-CM | POA: Diagnosis not present

## 2023-11-24 DIAGNOSIS — M9903 Segmental and somatic dysfunction of lumbar region: Secondary | ICD-10-CM | POA: Diagnosis not present

## 2023-11-24 DIAGNOSIS — M5432 Sciatica, left side: Secondary | ICD-10-CM | POA: Diagnosis not present

## 2023-11-24 DIAGNOSIS — M9901 Segmental and somatic dysfunction of cervical region: Secondary | ICD-10-CM | POA: Diagnosis not present

## 2023-11-26 DIAGNOSIS — M5432 Sciatica, left side: Secondary | ICD-10-CM | POA: Diagnosis not present

## 2023-11-26 DIAGNOSIS — M9901 Segmental and somatic dysfunction of cervical region: Secondary | ICD-10-CM | POA: Diagnosis not present

## 2023-11-26 DIAGNOSIS — M5412 Radiculopathy, cervical region: Secondary | ICD-10-CM | POA: Diagnosis not present

## 2023-11-26 DIAGNOSIS — M9903 Segmental and somatic dysfunction of lumbar region: Secondary | ICD-10-CM | POA: Diagnosis not present

## 2023-11-28 DIAGNOSIS — M9903 Segmental and somatic dysfunction of lumbar region: Secondary | ICD-10-CM | POA: Diagnosis not present

## 2023-11-28 DIAGNOSIS — M5412 Radiculopathy, cervical region: Secondary | ICD-10-CM | POA: Diagnosis not present

## 2023-11-28 DIAGNOSIS — M5432 Sciatica, left side: Secondary | ICD-10-CM | POA: Diagnosis not present

## 2023-11-28 DIAGNOSIS — M9901 Segmental and somatic dysfunction of cervical region: Secondary | ICD-10-CM | POA: Diagnosis not present

## 2023-12-01 DIAGNOSIS — M9901 Segmental and somatic dysfunction of cervical region: Secondary | ICD-10-CM | POA: Diagnosis not present

## 2023-12-01 DIAGNOSIS — M5412 Radiculopathy, cervical region: Secondary | ICD-10-CM | POA: Diagnosis not present

## 2023-12-01 DIAGNOSIS — M5432 Sciatica, left side: Secondary | ICD-10-CM | POA: Diagnosis not present

## 2023-12-01 DIAGNOSIS — M9903 Segmental and somatic dysfunction of lumbar region: Secondary | ICD-10-CM | POA: Diagnosis not present

## 2023-12-02 DIAGNOSIS — M47812 Spondylosis without myelopathy or radiculopathy, cervical region: Secondary | ICD-10-CM | POA: Diagnosis not present

## 2023-12-04 DIAGNOSIS — M47812 Spondylosis without myelopathy or radiculopathy, cervical region: Secondary | ICD-10-CM | POA: Diagnosis not present

## 2023-12-09 DIAGNOSIS — M9901 Segmental and somatic dysfunction of cervical region: Secondary | ICD-10-CM | POA: Diagnosis not present

## 2023-12-09 DIAGNOSIS — M5432 Sciatica, left side: Secondary | ICD-10-CM | POA: Diagnosis not present

## 2023-12-09 DIAGNOSIS — M5412 Radiculopathy, cervical region: Secondary | ICD-10-CM | POA: Diagnosis not present

## 2023-12-09 DIAGNOSIS — M9903 Segmental and somatic dysfunction of lumbar region: Secondary | ICD-10-CM | POA: Diagnosis not present

## 2023-12-10 DIAGNOSIS — M47812 Spondylosis without myelopathy or radiculopathy, cervical region: Secondary | ICD-10-CM | POA: Diagnosis not present

## 2024-05-01 ENCOUNTER — Other Ambulatory Visit: Payer: Self-pay | Admitting: Internal Medicine

## 2024-05-01 DIAGNOSIS — H10022 Other mucopurulent conjunctivitis, left eye: Secondary | ICD-10-CM

## 2024-05-01 MED ORDER — BACITRACIN-POLYMYXIN B 500-10000 UNIT/GM OP OINT: 1.0000 | TOPICAL_OINTMENT | Freq: Two times a day (BID) | OPHTHALMIC | 0 refills | Status: AC

## 2024-05-01 NOTE — Assessment & Plan Note (Signed)
 Affecting left eye,  with excessive mucoid drainage and swelling,  occurring In the setting of an URI.  Empiric abx ointment rx'd

## 2024-05-03 ENCOUNTER — Encounter: Payer: Self-pay | Admitting: Internal Medicine

## 2024-05-03 DIAGNOSIS — Z136 Encounter for screening for cardiovascular disorders: Secondary | ICD-10-CM

## 2024-05-05 ENCOUNTER — Ambulatory Visit
Admission: RE | Admit: 2024-05-05 | Discharge: 2024-05-05 | Disposition: A | Discharge status: Home / Self Care | Payer: Self-pay | Source: Ambulatory Visit | Attending: Internal Medicine | Admitting: Internal Medicine

## 2024-05-05 DIAGNOSIS — Z136 Encounter for screening for cardiovascular disorders: Secondary | ICD-10-CM | POA: Insufficient documentation

## 2024-05-06 ENCOUNTER — Ambulatory Visit: Payer: Self-pay | Admitting: Internal Medicine

## 2024-05-17 ENCOUNTER — Encounter: Payer: Self-pay | Admitting: Internal Medicine

## 2024-06-22 ENCOUNTER — Encounter: Payer: Self-pay | Admitting: Internal Medicine

## 2024-06-22 ENCOUNTER — Other Ambulatory Visit: Payer: Self-pay | Admitting: Internal Medicine

## 2024-06-22 MED ORDER — FLUCONAZOLE 150 MG PO TABS: 150.0000 mg | ORAL_TABLET | Freq: Every day | ORAL | 0 refills | Status: AC

## 2024-07-08 NOTE — Telephone Encounter (Signed)
 Daughter aware per Dr. Marylynn.
